# Patient Record
Sex: Male | Born: 1955 | Race: White | Hispanic: No | Marital: Married | Smoking: Never smoker
Health system: Southern US, Community
[De-identification: ages and names within clinical notes are randomized; demographics above are authoritative.]

## PROBLEM LIST (undated history)

## (undated) DIAGNOSIS — E785 Hyperlipidemia, unspecified: Secondary | ICD-10-CM

## (undated) DIAGNOSIS — N183 Chronic kidney disease, stage 3 unspecified: Secondary | ICD-10-CM

## (undated) DIAGNOSIS — N400 Enlarged prostate without lower urinary tract symptoms: Secondary | ICD-10-CM

## (undated) DIAGNOSIS — N200 Calculus of kidney: Secondary | ICD-10-CM

## (undated) DIAGNOSIS — E119 Type 2 diabetes mellitus without complications: Secondary | ICD-10-CM

## (undated) HISTORY — PX: KIDNEY STONE SURGERY: SHX686

---

## 1977-07-29 HISTORY — PX: KIDNEY STONE SURGERY: SHX686

## 2016-04-02 ENCOUNTER — Emergency Department: Payer: 59

## 2016-04-02 ENCOUNTER — Encounter: Payer: Self-pay | Admitting: Emergency Medicine

## 2016-04-02 ENCOUNTER — Emergency Department
Admission: EM | Admit: 2016-04-02 | Discharge: 2016-04-02 | Disposition: A | Discharge status: Home / Self Care | Payer: 59 | Attending: Emergency Medicine | Admitting: Emergency Medicine

## 2016-04-02 DIAGNOSIS — R339 Retention of urine, unspecified: Secondary | ICD-10-CM

## 2016-04-02 DIAGNOSIS — E119 Type 2 diabetes mellitus without complications: Secondary | ICD-10-CM | POA: Diagnosis not present

## 2016-04-02 DIAGNOSIS — K5900 Constipation, unspecified: Secondary | ICD-10-CM

## 2016-04-02 HISTORY — DX: Type 2 diabetes mellitus without complications: E11.9

## 2016-04-02 LAB — CBC WITH DIFFERENTIAL/PLATELET
BASOS ABS: 0 10*3/uL (ref 0–0.1)
BASOS PCT: 1 %
EOS PCT: 0 %
Eosinophils Absolute: 0 10*3/uL (ref 0–0.7)
HCT: 42 % (ref 40.0–52.0)
Hemoglobin: 14.7 g/dL (ref 13.0–18.0)
LYMPHS PCT: 11 %
Lymphs Abs: 1.1 10*3/uL (ref 1.0–3.6)
MCH: 30.7 pg (ref 26.0–34.0)
MCHC: 34.9 g/dL (ref 32.0–36.0)
MCV: 88 fL (ref 80.0–100.0)
MONO ABS: 0.4 10*3/uL (ref 0.2–1.0)
Monocytes Relative: 4 %
Neutro Abs: 8.5 10*3/uL — ABNORMAL HIGH (ref 1.4–6.5)
Neutrophils Relative %: 84 %
PLATELETS: 259 10*3/uL (ref 150–440)
RBC: 4.77 MIL/uL (ref 4.40–5.90)
RDW: 13.8 % (ref 11.5–14.5)
WBC: 10.1 10*3/uL (ref 3.8–10.6)

## 2016-04-02 LAB — URINALYSIS COMPLETE WITH MICROSCOPIC (ARMC ONLY)
Bacteria, UA: NONE SEEN
Bilirubin Urine: NEGATIVE
Glucose, UA: >500 mg/dL — AB
Leukocytes, UA: NEGATIVE
NITRITE: NEGATIVE
PH: 5 (ref 5.0–8.0)
PROTEIN: NEGATIVE mg/dL
SPECIFIC GRAVITY, URINE: 1.027 (ref 1.005–1.030)
Squamous Epithelial / LPF: NONE SEEN

## 2016-04-02 LAB — BASIC METABOLIC PANEL
Anion gap: 6 (ref 5–15)
BUN: 22 mg/dL — ABNORMAL HIGH (ref 6–20)
CO2: 22 mmol/L (ref 22–32)
Calcium: 9.2 mg/dL (ref 8.9–10.3)
Chloride: 108 mmol/L (ref 101–111)
Creatinine, Ser: 1.3 mg/dL — ABNORMAL HIGH (ref 0.61–1.24)
GFR calc Af Amer: >60 mL/min (ref >60)
GFR calc non Af Amer: 59 mL/min — ABNORMAL LOW (ref >60)
Glucose, Bld: 243 mg/dL — ABNORMAL HIGH (ref 65–99)
Potassium: 4.8 mmol/L (ref 3.5–5.1)
Sodium: 136 mmol/L (ref 135–145)

## 2016-04-02 MED ORDER — LIDOCAINE HCL 2 % EX GEL
1.0000 "application " | Freq: Once | CUTANEOUS | Status: AC
  Administered 2016-04-02: 1 via URETHRAL
  Filled 2016-04-02: qty 5

## 2016-04-02 MED ORDER — LIDOCAINE HCL 2 % EX GEL
CUTANEOUS | Status: AC
Start: 2016-04-02 — End: 2016-04-02
  Administered 2016-04-02: 1 via URETHRAL
  Filled 2016-04-02: qty 10

## 2016-04-02 NOTE — Discharge Instructions (Signed)
Please return immediately if condition worsens. Please contact her primary physician or the physician you were given for referral. If you have any specialist physicians involved in her treatment and plan please also contact them. Thank you for using [facility] emergency [facility].  Return to emergency department especially for fever. Please contact the urologist for further outpatient follow-up for urinary retention and likely enlarged prostate

## 2016-04-02 NOTE — ED Notes (Signed)
Bladder scan: 693mL 

## 2016-04-02 NOTE — ED Triage Notes (Signed)
Pt to ED from home c/o urinary retention since for several days.  Pt states decreased urine x1 month but getting worse.  States today the feeling to pee but only had a few drops.  States burning with urination, darker and stronger odor than normal.

## 2016-04-02 NOTE — ED Provider Notes (Signed)
Time Seen: Approximately1903  I have reviewed the triage notes  Chief Complaint: Urinary Retention   History of Present Illness: Ryan Pugh is a 60 y.o. male who states that he's had some symptoms consistent with urinary retention but is still been able to urinate with straining over the past several days. He states today his been more intense and is not able to urinate essentially just dribbled urine on occasion. He was able to give [place] a urine sample. Ultrasound after urination in triage area shows greater than 600 mL of retained urine. She did discuss this with his primary physician and thought that he might have some prostatic hypertrophy and is also on medication that may cause urinary retention for his diabetes. He states his blood sugars have been running fine his most recent hemoglobin A1c is within normal limits. He states he's also had some symptoms of constipation without low back pain or leg weakness. He states he normally has a bowel movement every other day but is been several days since he's had a normal bowel movement. He denies any abdominal discomfort.   Past Medical History:  Diagnosis Date  . Diabetes mellitus without complication (HCC)     There are no active problems to display for this patient.   Past Surgical History:  Procedure Laterality Date  . KIDNEY STONE SURGERY      Past Surgical History:  Procedure Laterality Date  . KIDNEY STONE SURGERY        Allergies:  Review of patient's allergies indicates no known allergies.  Family History: History reviewed. No pertinent family history.  Social History: Social History  Substance Use Topics  . Smoking status: Never Smoker  . Smokeless tobacco: Never Used  . Alcohol use No     Review of Systems:   10 point review of systems was performed and was otherwise negative:  Constitutional: No fever Eyes: No visual disturbances ENT: No sore throat, ear pain Cardiac: No chest pain Respiratory: No  shortness of breath, wheezing, or stridor Abdomen: No abdominal pain, no vomiting, No diarrhea Endocrine: No weight loss, No night sweats Extremities: No peripheral edema, cyanosis Skin: No rashes, easy bruising Neurologic: No focal weakness, trouble with speech or swollowing Urologic: Patient denies any burning with urination or obvious hematuria   Physical Exam:  ED Triage Vitals  Enc Vitals Group     BP 04/02/16 1627 128/71     Pulse Rate 04/02/16 1627 (!) 106     Resp 04/02/16 1627 18     Temp 04/02/16 1627 98 F (36.7 C)     Temp Source 04/02/16 1627 Oral     SpO2 04/02/16 1627 97 %     Weight 04/02/16 1628 184 lb (83.5 kg)     Height 04/02/16 1628 5\' 9"  (1.753 m)     Head Circumference --      Peak Flow --      Pain Score 04/02/16 1942 2     Pain Loc --      Pain Edu? --      Excl. in GC? --     General: Awake , Alert , and Oriented times 3; GCS 15 Head: Normal cephalic , atraumatic Eyes: Pupils equal , round, reactive to light Nose/Throat: No nasal drainage, patent upper airway without erythema or exudate.  Neck: Supple, Full range of motion, No anterior adenopathy or palpable thyroid masses Lungs: Clear to ascultation without wheezes , rhonchi, or rales Heart: Regular rate, regular rhythm without murmurs ,  gallops , or rubs Abdomen: Soft, non tender without rebound, guarding , or rigidity; bowel sounds positive and symmetric in all 4 quadrants. No organomegaly .        Extremities: 2 plus symmetric pulses. No edema, clubbing or cyanosis Neurologic: normal ambulation, Motor symmetric without deficits, sensory intact Skin: warm, dry, no rashes   Labs:   All laboratory work was reviewed including any pertinent negatives or positives listed below:  Labs Reviewed  URINALYSIS COMPLETEWITH MICROSCOPIC ([facility] ONLY) - Abnormal; Notable for the following:       Result Value   Color, Urine YELLOW (*)    APPearance CLEAR (*)    Glucose, UA >500 (*)    Ketones, ur TRACE  (*)    Hgb urine dipstick 2+ (*)    All other components within normal limits  BASIC METABOLIC PANEL - Abnormal; Notable for the following:    Glucose, Bld 243 (*)    BUN 22 (*)    Creatinine, Ser 1.30 (*)    GFR calc non Af Amer 59 (*)    All other components within normal limits  CBC WITH DIFFERENTIAL/PLATELET - Abnormal; Notable for the following:    Neutro Abs 8.5 (*)    All other components within normal limits  I felt given his history of diabetes his lab work appears to be within normal limits   Radiology  "Dg Abd Acute W/chest  Result Date: 04/02/2016 CLINICAL DATA:  60 year old male with constipation EXAM: DG ABDOMEN ACUTE W/ 1V CHEST COMPARISON:  None. FINDINGS: The lungs are clear. There is no pleural effusion or pneumothorax. The cardiac silhouette is within normal limits. There is copious amount of stool throughout the colon. No bowel dilatation or evidence of obstruction. No free air or radiopaque calculi. There is mild degenerative changes of the spine. No acute fracture. IMPRESSION: No acute cardiopulmonary process. Constipation.  No bowel obstruction. Electronically Signed   By: Elgie CollardArash  Radparvar M.D.   On: 04/02/2016 19:58  "  Procedures:  Patient had a Foley catheter inserted by the nursing staff and had greater than 400 mL of urine output. His large bag was switched out for a leg bag with instructions  ED Course:  Patient's stay here was uneventful and felt improved with his Foley catheter though still has some urinary urgency. Patient was referred to urology unassigned. We also discussed his constipation as this may help with the results as constipation with his urinary output. He was given constipation instructions.   Clinical Course     Assessment: * Urinary retention Constipation History of diabetes  Final Clinical Impression:   Final diagnoses:  Urinary retention  Constipation, unspecified constipation type     Plan:  Outpatient Patient was  advised to return immediately if condition worsens. Patient was advised to follow up with their primary care physician or other specialized physicians involved in their outpatient care. The patient and/or family member/power of attorney had laboratory results reviewed at the bedside. All questions and concerns were addressed and appropriate discharge instructions were distributed by the nursing staff.             Jennye MoccasinBrian S Kennadee Walthour, MD 04/02/16 2117

## 2016-04-02 NOTE — ED Notes (Signed)
Discharge instructions reviewed with patient. Patient verbalized understanding. Patient ambulated to lobby without difficulty.   

## 2016-04-02 NOTE — ED Notes (Signed)
Pt called for room x1 with no answer

## 2016-04-02 NOTE — ED Notes (Addendum)
Pt's catheter switched to leg bag, pt given education on catheter and leg bag. Pt gave this RN return demonstration on how to switch from regular bag to leg bag and verbalized understanding of education.

## 2016-04-18 ENCOUNTER — Ambulatory Visit: Payer: Self-pay | Admitting: Urology

## 2017-07-07 IMAGING — CR DG ABDOMEN ACUTE W/ 1V CHEST
4 series · 4 of 4 positions shown · non-contrast
Comparison: None.

CLINICAL DATA: 52-year-old male with constipation

EXAM:
DG ABDOMEN ACUTE W/ 1V CHEST

[chest pa]
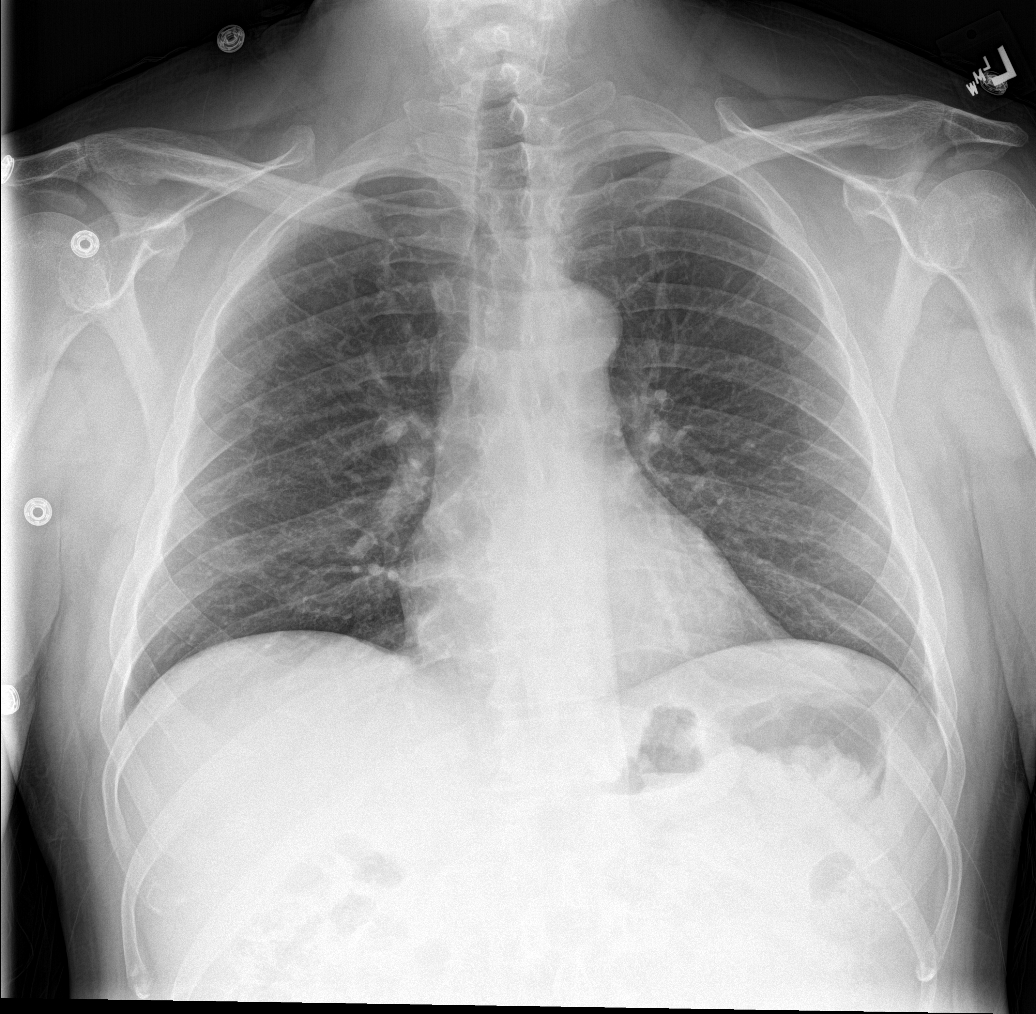

[abdomen erect]
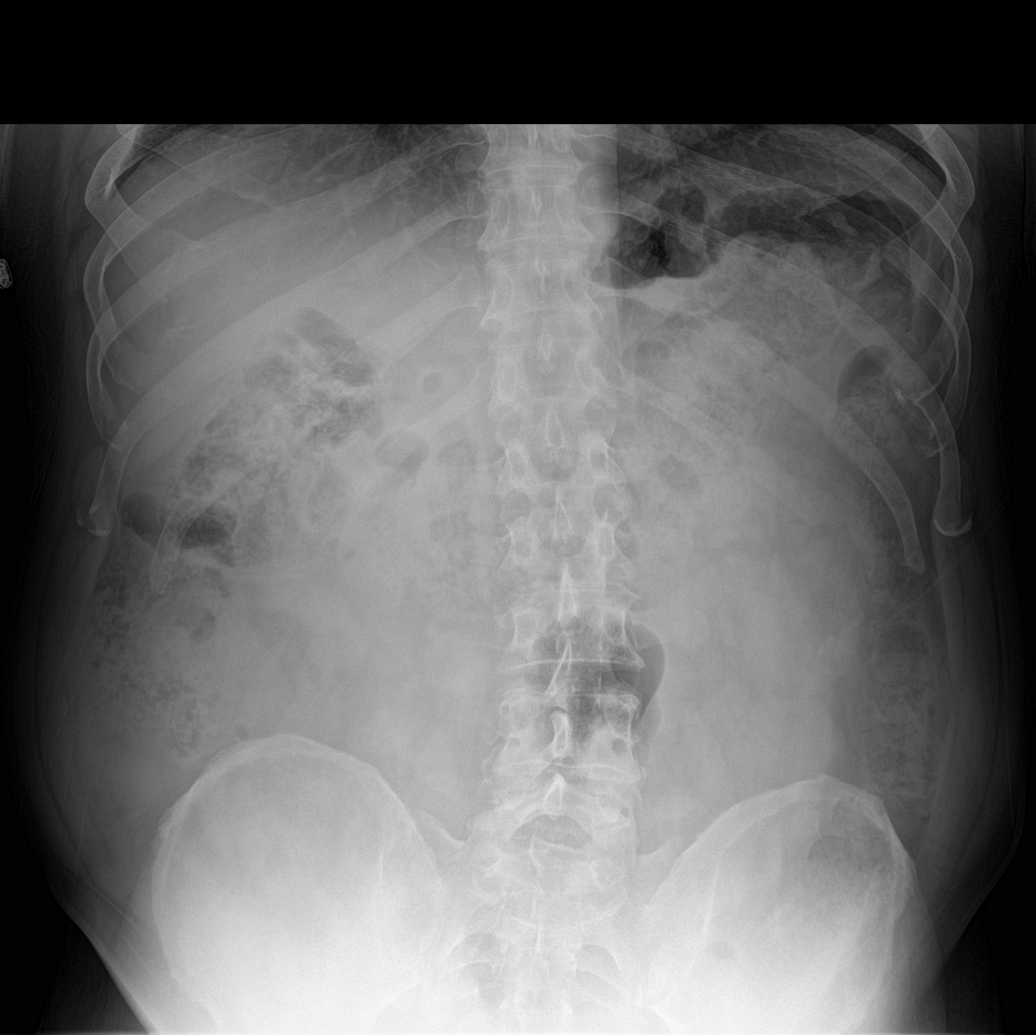

[abdomen supine (1 of 2)]
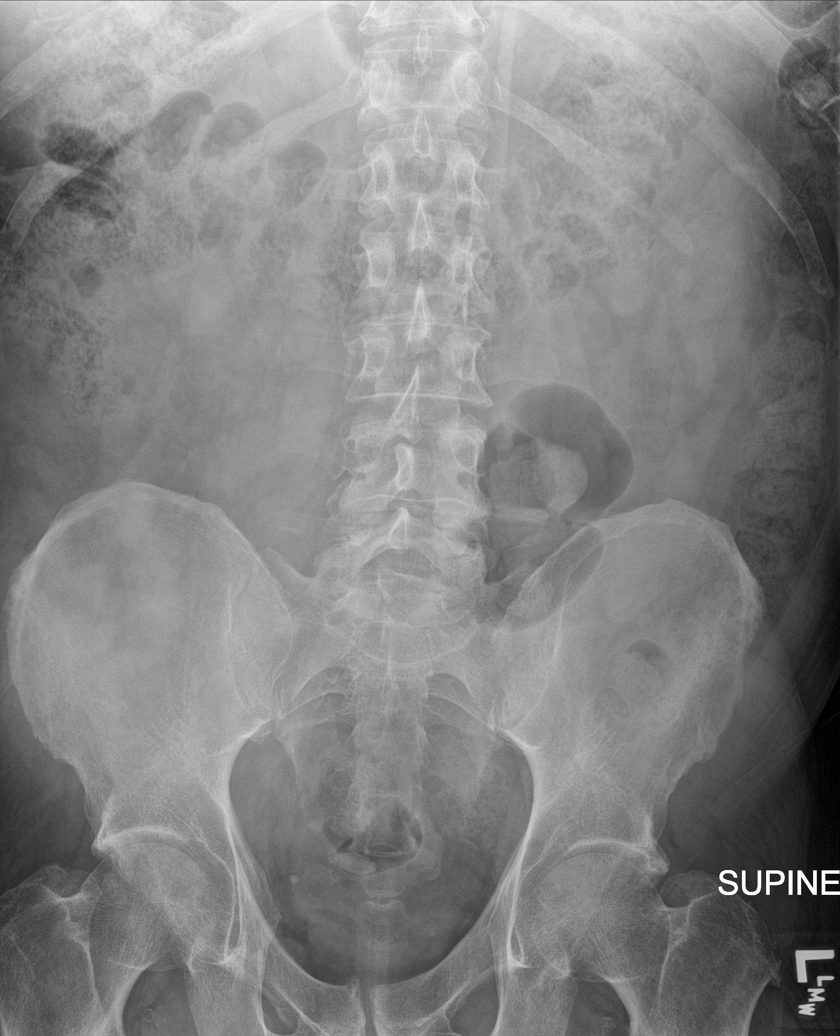

[abdomen supine (2 of 2)]
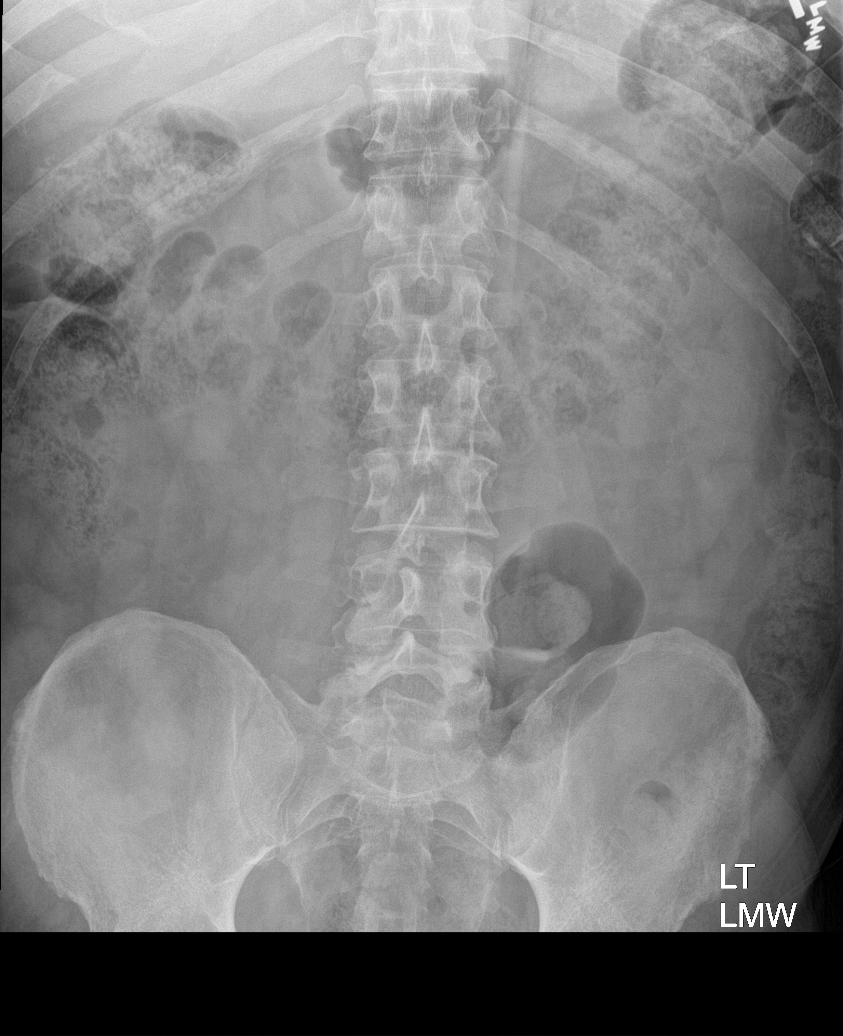

[4 of 4 positions shown; findings below may reference images not displayed]

FINDINGS: The lungs are clear. There is no pleural effusion or pneumothorax.
The cardiac silhouette is within normal limits.

There is copious amount of stool throughout the colon. No bowel
dilatation or evidence of obstruction. No free air or radiopaque
calculi. There is mild degenerative changes of the spine. No acute
fracture.
IMPRESSION: No acute cardiopulmonary process.

Constipation.  No bowel obstruction.

## 2021-07-12 ENCOUNTER — Encounter: Payer: Self-pay | Admitting: Emergency Medicine

## 2021-07-12 ENCOUNTER — Other Ambulatory Visit: Payer: Self-pay

## 2021-07-12 ENCOUNTER — Emergency Department: Payer: Managed Care, Other (non HMO)

## 2021-07-12 ENCOUNTER — Inpatient Hospital Stay
Admission: EM | Admit: 2021-07-12 | Discharge: 2021-07-15 | DRG: 854 | Disposition: A | Discharge status: Home / Self Care | Payer: Managed Care, Other (non HMO) | Attending: Internal Medicine | Admitting: Internal Medicine

## 2021-07-12 DIAGNOSIS — K760 Fatty (change of) liver, not elsewhere classified: Secondary | ICD-10-CM | POA: Diagnosis present

## 2021-07-12 DIAGNOSIS — N1831 Chronic kidney disease, stage 3a: Secondary | ICD-10-CM | POA: Diagnosis present

## 2021-07-12 DIAGNOSIS — E1122 Type 2 diabetes mellitus with diabetic chronic kidney disease: Secondary | ICD-10-CM | POA: Diagnosis present

## 2021-07-12 DIAGNOSIS — N210 Calculus in bladder: Secondary | ICD-10-CM | POA: Diagnosis present

## 2021-07-12 DIAGNOSIS — N179 Acute kidney failure, unspecified: Secondary | ICD-10-CM | POA: Diagnosis present

## 2021-07-12 DIAGNOSIS — Z808 Family history of malignant neoplasm of other organs or systems: Secondary | ICD-10-CM | POA: Diagnosis not present

## 2021-07-12 DIAGNOSIS — K4090 Unilateral inguinal hernia, without obstruction or gangrene, not specified as recurrent: Secondary | ICD-10-CM | POA: Diagnosis present

## 2021-07-12 DIAGNOSIS — Z87442 Personal history of urinary calculi: Secondary | ICD-10-CM | POA: Diagnosis not present

## 2021-07-12 DIAGNOSIS — R7881 Bacteremia: Secondary | ICD-10-CM | POA: Diagnosis not present

## 2021-07-12 DIAGNOSIS — A411 Sepsis due to other specified staphylococcus: Principal | ICD-10-CM | POA: Diagnosis present

## 2021-07-12 DIAGNOSIS — E1129 Type 2 diabetes mellitus with other diabetic kidney complication: Secondary | ICD-10-CM | POA: Diagnosis present

## 2021-07-12 DIAGNOSIS — N183 Chronic kidney disease, stage 3 unspecified: Secondary | ICD-10-CM | POA: Diagnosis present

## 2021-07-12 DIAGNOSIS — Z7984 Long term (current) use of oral hypoglycemic drugs: Secondary | ICD-10-CM

## 2021-07-12 DIAGNOSIS — I700 Atherosclerosis of aorta: Secondary | ICD-10-CM | POA: Diagnosis present

## 2021-07-12 DIAGNOSIS — Z20822 Contact with and (suspected) exposure to covid-19: Secondary | ICD-10-CM | POA: Diagnosis present

## 2021-07-12 DIAGNOSIS — E1165 Type 2 diabetes mellitus with hyperglycemia: Secondary | ICD-10-CM | POA: Diagnosis present

## 2021-07-12 DIAGNOSIS — N390 Urinary tract infection, site not specified: Secondary | ICD-10-CM | POA: Diagnosis present

## 2021-07-12 DIAGNOSIS — A419 Sepsis, unspecified organism: Secondary | ICD-10-CM

## 2021-07-12 DIAGNOSIS — N1330 Unspecified hydronephrosis: Secondary | ICD-10-CM

## 2021-07-12 DIAGNOSIS — Z7982 Long term (current) use of aspirin: Secondary | ICD-10-CM | POA: Diagnosis not present

## 2021-07-12 DIAGNOSIS — R6520 Severe sepsis without septic shock: Secondary | ICD-10-CM | POA: Diagnosis present

## 2021-07-12 DIAGNOSIS — Z79899 Other long term (current) drug therapy: Secondary | ICD-10-CM | POA: Diagnosis not present

## 2021-07-12 DIAGNOSIS — N136 Pyonephrosis: Secondary | ICD-10-CM | POA: Diagnosis present

## 2021-07-12 DIAGNOSIS — D72829 Elevated white blood cell count, unspecified: Secondary | ICD-10-CM | POA: Diagnosis not present

## 2021-07-12 DIAGNOSIS — E785 Hyperlipidemia, unspecified: Secondary | ICD-10-CM | POA: Diagnosis present

## 2021-07-12 DIAGNOSIS — Z794 Long term (current) use of insulin: Secondary | ICD-10-CM

## 2021-07-12 DIAGNOSIS — R52 Pain, unspecified: Secondary | ICD-10-CM

## 2021-07-12 DIAGNOSIS — N200 Calculus of kidney: Secondary | ICD-10-CM | POA: Diagnosis present

## 2021-07-12 DIAGNOSIS — R000 Tachycardia, unspecified: Secondary | ICD-10-CM

## 2021-07-12 HISTORY — DX: Calculus of kidney: N20.0

## 2021-07-12 HISTORY — DX: Hyperlipidemia, unspecified: E78.5

## 2021-07-12 HISTORY — DX: Chronic kidney disease, stage 3 unspecified: N18.30

## 2021-07-12 LAB — COMPREHENSIVE METABOLIC PANEL
ALT: 26 U/L (ref 0–44)
ALT: 24 U/L (ref 0–44)
AST: 26 U/L (ref 15–41)
AST: 20 U/L (ref 15–41)
Albumin: 4.6 g/dL (ref 3.5–5.0)
Albumin: 3.6 g/dL (ref 3.5–5.0)
Alkaline Phosphatase: 107 U/L (ref 38–126)
Alkaline Phosphatase: 88 U/L (ref 38–126)
Anion gap: 13 (ref 5–15)
Anion gap: 8 (ref 5–15)
BUN: 24 mg/dL — ABNORMAL HIGH (ref 8–23)
BUN: 21 mg/dL (ref 8–23)
CO2: 18 mmol/L — ABNORMAL LOW (ref 22–32)
CO2: 20 mmol/L — ABNORMAL LOW (ref 22–32)
Calcium: 9.3 mg/dL (ref 8.9–10.3)
Calcium: 8.1 mg/dL — ABNORMAL LOW (ref 8.9–10.3)
Chloride: 101 mmol/L (ref 98–111)
Chloride: 105 mmol/L (ref 98–111)
Creatinine, Ser: 1.71 mg/dL — ABNORMAL HIGH (ref 0.61–1.24)
Creatinine, Ser: 1.76 mg/dL — ABNORMAL HIGH (ref 0.61–1.24)
GFR, Estimated: 44 mL/min — ABNORMAL LOW (ref >60)
GFR, Estimated: 43 mL/min — ABNORMAL LOW (ref >60)
Glucose, Bld: 425 mg/dL — ABNORMAL HIGH (ref 70–99)
Glucose, Bld: 366 mg/dL — ABNORMAL HIGH (ref 70–99)
Potassium: 4.3 mmol/L (ref 3.5–5.1)
Potassium: 4.5 mmol/L (ref 3.5–5.1)
Sodium: 132 mmol/L — ABNORMAL LOW (ref 135–145)
Sodium: 133 mmol/L — ABNORMAL LOW (ref 135–145)
Total Bilirubin: 1.5 mg/dL — ABNORMAL HIGH (ref 0.3–1.2)
Total Bilirubin: 0.9 mg/dL (ref 0.3–1.2)
Total Protein: 7.5 g/dL (ref 6.5–8.1)
Total Protein: 6.3 g/dL — ABNORMAL LOW (ref 6.5–8.1)

## 2021-07-12 LAB — CBC WITH DIFFERENTIAL/PLATELET
Abs Immature Granulocytes: 0.08 10*3/uL — ABNORMAL HIGH (ref 0.00–0.07)
Basophils Absolute: 0 10*3/uL (ref 0.0–0.1)
Basophils Relative: 0 %
Eosinophils Absolute: 0 10*3/uL (ref 0.0–0.5)
Eosinophils Relative: 0 %
HCT: 40.7 % (ref 39.0–52.0)
Hemoglobin: 14.1 g/dL (ref 13.0–17.0)
Immature Granulocytes: 1 %
Lymphocytes Relative: 3 %
Lymphs Abs: 0.4 10*3/uL — ABNORMAL LOW (ref 0.7–4.0)
MCH: 30.2 pg (ref 26.0–34.0)
MCHC: 34.6 g/dL (ref 30.0–36.0)
MCV: 87.2 fL (ref 80.0–100.0)
Monocytes Absolute: 0.5 10*3/uL (ref 0.1–1.0)
Monocytes Relative: 3 %
Neutro Abs: 13.3 10*3/uL — ABNORMAL HIGH (ref 1.7–7.7)
Neutrophils Relative %: 93 %
Platelets: 281 10*3/uL (ref 150–400)
RBC: 4.67 MIL/uL (ref 4.22–5.81)
RDW: 12.6 % (ref 11.5–15.5)
WBC: 14.3 10*3/uL — ABNORMAL HIGH (ref 4.0–10.5)
nRBC: 0 % (ref 0.0–0.2)

## 2021-07-12 LAB — CBC
HCT: 42.4 % (ref 39.0–52.0)
Hemoglobin: 15.4 g/dL (ref 13.0–17.0)
MCH: 31 pg (ref 26.0–34.0)
MCHC: 36.3 g/dL — ABNORMAL HIGH (ref 30.0–36.0)
MCV: 85.5 fL (ref 80.0–100.0)
Platelets: 280 10*3/uL (ref 150–400)
RBC: 4.96 MIL/uL (ref 4.22–5.81)
RDW: 12.4 % (ref 11.5–15.5)
WBC: 13.2 10*3/uL — ABNORMAL HIGH (ref 4.0–10.5)
nRBC: 0 % (ref 0.0–0.2)

## 2021-07-12 LAB — RESP PANEL BY RT-PCR (FLU A&B, COVID) ARPGX2
Influenza A by PCR: NEGATIVE
Influenza B by PCR: NEGATIVE
SARS Coronavirus 2 by RT PCR: NEGATIVE

## 2021-07-12 LAB — CBG MONITORING, ED
Glucose-Capillary: 393 mg/dL — ABNORMAL HIGH (ref 70–99)
Glucose-Capillary: 340 mg/dL — ABNORMAL HIGH (ref 70–99)
Glucose-Capillary: 324 mg/dL — ABNORMAL HIGH (ref 70–99)
Glucose-Capillary: 382 mg/dL — ABNORMAL HIGH (ref 70–99)

## 2021-07-12 LAB — URINALYSIS, MICROSCOPIC (REFLEX): Squamous Epithelial / LPF: NONE SEEN (ref 0–5)

## 2021-07-12 LAB — URINALYSIS, ROUTINE W REFLEX MICROSCOPIC
Bilirubin Urine: NEGATIVE
Glucose, UA: >1000 mg/dL — AB
Ketones, ur: 80 mg/dL — AB
Leukocytes,Ua: NEGATIVE
Nitrite: POSITIVE — AB
Protein, ur: NEGATIVE mg/dL
Specific Gravity, Urine: 1.01 (ref 1.005–1.030)
pH: 6.5 (ref 5.0–8.0)

## 2021-07-12 LAB — GLUCOSE, CAPILLARY: Glucose-Capillary: 349 mg/dL — ABNORMAL HIGH (ref 70–99)

## 2021-07-12 LAB — HIV ANTIBODY (ROUTINE TESTING W REFLEX): HIV Screen 4th Generation wRfx: NONREACTIVE

## 2021-07-12 LAB — PROTIME-INR
INR: 1.1 (ref 0.8–1.2)
Prothrombin Time: 13.8 seconds (ref 11.4–15.2)

## 2021-07-12 LAB — LACTIC ACID, PLASMA
Lactic Acid, Venous: 2.8 mmol/L (ref 0.5–1.9)
Lactic Acid, Venous: 3 mmol/L (ref 0.5–1.9)

## 2021-07-12 LAB — LIPASE, BLOOD: Lipase: 41 U/L (ref 11–51)

## 2021-07-12 LAB — APTT: aPTT: 23 seconds — ABNORMAL LOW (ref 24–36)

## 2021-07-12 MED ORDER — HYDROMORPHONE HCL 1 MG/ML IJ SOLN
1.0000 mg | Freq: Once | INTRAMUSCULAR | Status: AC
  Administered 2021-07-12: 1 mg via INTRAVENOUS
  Filled 2021-07-12: qty 1

## 2021-07-12 MED ORDER — SODIUM CHLORIDE 0.9 % IV SOLN
1.0000 g | INTRAVENOUS | Status: DC
  Administered 2021-07-13: 2 g via INTRAVENOUS
  Filled 2021-07-12: qty 1

## 2021-07-12 MED ORDER — LEVOFLOXACIN 500 MG PO TABS: 500.0000 mg | ORAL_TABLET | Freq: Every day | ORAL | 0 refills | Status: DC

## 2021-07-12 MED ORDER — TAMSULOSIN HCL 0.4 MG PO CAPS: 0.4000 mg | ORAL_CAPSULE | Freq: Every day | ORAL | 0 refills | Status: DC

## 2021-07-12 MED ORDER — SODIUM CHLORIDE 0.9 % IV BOLUS
1000.0000 mL | Freq: Once | INTRAVENOUS | Status: AC
  Administered 2021-07-12: 1000 mL via INTRAVENOUS

## 2021-07-12 MED ORDER — ONDANSETRON 4 MG PO TBDP: 4.0000 mg | ORAL_TABLET | Freq: Three times a day (TID) | ORAL | 0 refills | Status: DC | PRN

## 2021-07-12 MED ORDER — METOCLOPRAMIDE HCL 5 MG/ML IJ SOLN
10.0000 mg | Freq: Once | INTRAMUSCULAR | Status: AC
  Administered 2021-07-12: 10 mg via INTRAVENOUS
  Filled 2021-07-12: qty 2

## 2021-07-12 MED ORDER — INSULIN ASPART 100 UNIT/ML IJ SOLN
0.0000 [IU] | Freq: Three times a day (TID) | INTRAMUSCULAR | Status: DC
  Administered 2021-07-13 (×2): 3 [IU] via SUBCUTANEOUS
  Administered 2021-07-13: 5 [IU] via SUBCUTANEOUS
  Administered 2021-07-14: 1 [IU] via SUBCUTANEOUS
  Administered 2021-07-14: 2 [IU] via SUBCUTANEOUS
  Administered 2021-07-14: 1 [IU] via SUBCUTANEOUS
  Administered 2021-07-15: 12:00:00 2 [IU] via SUBCUTANEOUS
  Filled 2021-07-12 (×7): qty 1

## 2021-07-12 MED ORDER — CIPROFLOXACIN IN D5W 400 MG/200ML IV SOLN: 400.0000 mg | Freq: Once | INTRAVENOUS | Status: DC

## 2021-07-12 MED ORDER — INSULIN ASPART 100 UNIT/ML IJ SOLN
0.0000 [IU] | Freq: Every day | INTRAMUSCULAR | Status: DC
  Administered 2021-07-12: 21:00:00 4 [IU] via SUBCUTANEOUS
  Administered 2021-07-13: 2 [IU] via SUBCUTANEOUS
  Filled 2021-07-12 (×2): qty 1

## 2021-07-12 MED ORDER — ACETAMINOPHEN 325 MG PO TABS: 650.0000 mg | ORAL_TABLET | Freq: Four times a day (QID) | ORAL | Status: DC | PRN

## 2021-07-12 MED ORDER — MORPHINE SULFATE (PF) 2 MG/ML IV SOLN
2.0000 mg | INTRAVENOUS | Status: DC | PRN
  Administered 2021-07-12 (×2): 2 mg via INTRAVENOUS
  Filled 2021-07-12 (×2): qty 1

## 2021-07-12 MED ORDER — OXYCODONE-ACETAMINOPHEN 5-325 MG PO TABS
1.0000 | ORAL_TABLET | ORAL | Status: DC | PRN
  Administered 2021-07-12: 1 via ORAL
  Filled 2021-07-12: qty 1

## 2021-07-12 MED ORDER — OXYCODONE-ACETAMINOPHEN 7.5-325 MG PO TABS: 1.0000 | ORAL_TABLET | ORAL | 0 refills | Status: DC | PRN

## 2021-07-12 MED ORDER — FENTANYL CITRATE PF 50 MCG/ML IJ SOSY
25.0000 ug | PREFILLED_SYRINGE | INTRAMUSCULAR | Status: DC | PRN
  Administered 2021-07-12 – 2021-07-13 (×4): 25 ug via INTRAVENOUS
  Filled 2021-07-12 (×4): qty 1

## 2021-07-12 MED ORDER — FENTANYL CITRATE PF 50 MCG/ML IJ SOSY
50.0000 ug | PREFILLED_SYRINGE | Freq: Once | INTRAMUSCULAR | Status: AC
  Administered 2021-07-12: 50 ug via INTRAVENOUS
  Filled 2021-07-12: qty 1

## 2021-07-12 MED ORDER — INSULIN ASPART 100 UNIT/ML IV SOLN
5.0000 [IU] | Freq: Once | INTRAVENOUS | Status: AC
  Administered 2021-07-12: 5 [IU] via INTRAVENOUS
  Filled 2021-07-12 (×2): qty 0.05

## 2021-07-12 MED ORDER — MORPHINE SULFATE (PF) 2 MG/ML IV SOLN: 2.0000 mg | INTRAVENOUS | Status: DC | PRN

## 2021-07-12 MED ORDER — AMOXICILLIN-POT CLAVULANATE 875-125 MG PO TABS: 1.0000 | ORAL_TABLET | Freq: Two times a day (BID) | ORAL | 0 refills | Status: DC

## 2021-07-12 MED ORDER — INSULIN GLARGINE-YFGN 100 UNIT/ML ~~LOC~~ SOLN
12.0000 [IU] | Freq: Every day | SUBCUTANEOUS | Status: DC
  Administered 2021-07-12 – 2021-07-15 (×4): 12 [IU] via SUBCUTANEOUS
  Filled 2021-07-12 (×5): qty 0.12

## 2021-07-12 MED ORDER — SODIUM CHLORIDE 0.9 % IV SOLN
1.0000 g | Freq: Once | INTRAVENOUS | Status: AC
  Administered 2021-07-12: 1 g via INTRAVENOUS
  Filled 2021-07-12: qty 10

## 2021-07-12 MED ORDER — SODIUM CHLORIDE 0.9 % IV SOLN: INTRAVENOUS | Status: DC

## 2021-07-12 MED ORDER — ONDANSETRON HCL 4 MG/2ML IJ SOLN
4.0000 mg | Freq: Once | INTRAMUSCULAR | Status: AC
  Administered 2021-07-12: 4 mg via INTRAVENOUS
  Filled 2021-07-12: qty 2

## 2021-07-12 MED ORDER — ONDANSETRON HCL 4 MG/2ML IJ SOLN
4.0000 mg | Freq: Three times a day (TID) | INTRAMUSCULAR | Status: DC | PRN
  Administered 2021-07-12 – 2021-07-13 (×2): 4 mg via INTRAVENOUS
  Filled 2021-07-12 (×2): qty 2

## 2021-07-12 MED ORDER — IOHEXOL 300 MG/ML  SOLN
80.0000 mL | Freq: Once | INTRAMUSCULAR | Status: AC | PRN
  Administered 2021-07-12: 80 mL via INTRAVENOUS
  Filled 2021-07-12: qty 80

## 2021-07-12 NOTE — ED Notes (Signed)
Pt to ED for sudden onset N/V and abdominal pain that started this morning. EMS IV 20# L AC in place. CBG was in 400s when EMS checked, see triage note. Hx DM, usually well controlled. Denies other PMH. Pt appears pale, tachypneic and slightly diaphoretic. Complains of LLQ sharp abdominal pain, 10/10 with dull pain throughout abdomen. Bowel sounds are hypoactive and abdomen appears distended.

## 2021-07-12 NOTE — Consult Note (Signed)
Urology Consult   I have been asked to see the patient by Dr. Blaine Hamper, for evaluation and management of bladder stone, left hydronephrosis, possible UTI.  Chief Complaint: Left flank pain  HPI:  Ryan Pugh is a 65 y.o. year old with diabetes(hemoglobin A1c 8.8) who presents with 12 hours of severe left-sided flank pain and nausea.  He denies any fevers, chills, or urinary symptoms.  He denies any problems with urination aside from weak stream overnight.  He does have a history of urinary retention in 2017, and catheter was reportedly removed 2 to 3 weeks later.  He has not followed with a urologist since then.  He reports a kidney stone when he was a teenager that required surgery.  He denies any gross hematuria or dysuria.  He denies any history of UTI.  PSA has been normal, most recently 3.4 in August 2022 which was stable over the last few years.  CT in the ER with contrast shows minimal left-sided hydroureteronephrosis with some significant perinephric stranding as well as stranding around the proximal ureter.  No ureteral stones are visualized.  There is a large 3 cm bladder stone.  Prostate measures 41 g.  No prior cross-sectional imaging to review.  Labs in the ER notable for mild leukocytosis to 14 K with left shift, AKI with creatinine of 1.76(eGFR 43) from a baseline creatinine of ~1.3.  Urinalysis nitrite positive, negative leukocytes, rare bacteria, 0-5 WBCs, 0-5 RBCs.  Lactic acid pending.  PMH: Past Medical History:  Diagnosis Date   CKD (chronic kidney disease), stage III ([place])    Diabetes mellitus without complication ([place])    HLD (hyperlipidemia)    Kidney stone     Surgical History: Past Surgical History:  Procedure Laterality Date   KIDNEY STONE SURGERY       Allergies: No Known Allergies   Social History:  reports that he has never smoked. He has never used smokeless tobacco. He reports that he does not drink alcohol and does not use drugs.  ROS: Negative  aside from those stated in the HPI.  Physical Exam: BP (!) 148/89    Pulse (!) 114    Temp 98.9 F (37.2 C) (Oral)    Resp 19    Ht 5' 9"  (1.753 m)    Wt 83.5 kg    SpO2 97%    BMI 27.17 kg/m    Constitutional: Resting comfortably in bed, conversational Cardiovascular: Tachycardic, regular rhythm Respiratory: Clear to auscultation bilaterally GI: Abdomen is soft, nontender, nondistended, no abdominal masses GU: Left CVA tenderness Lymph: No cervical or inguinal lymphadenopathy. Skin: No rashes, bruises or suspicious lesions. Neurologic: Grossly intact, no focal deficits, moving all 4 extremities. Psychiatric: Normal mood and affect.   Laboratory Data: Reviewed, see HPI  Pertinent Imaging: I have personally reviewed the CT in the ER with contrast shows minimal left-sided hydroureteronephrosis with some significant perinephric stranding as well as stranding around the proximal ureter.  No ureteral stones are visualized.  There is a large 3 cm bladder stone.  Prostate measures 41 g.  No prior cross-sectional imaging to review.  Assessment & Plan:   65 year old male with acute onset of 12 hours of left-sided flank pain and nausea of unclear etiology.  CT shows minimal left-sided hydronephrosis with no evidence of ureteral stones, there is a large 3 cm bladder stone in a nondistended bladder.  Mild leukocytosis with a left shift, but urinalysis relatively benign.  Lactic acid pending.  Afebrile but  tachycardic, normotensive in ER.  We discussed possible etiologies including spontaneously passed stone, pyelonephritis, other obstructing left ureteral lesion like mass or debris from infection, as well as likely incomplete long-term bladder emptying with large bladder stone.  Possible etiologies of his bladder stone include BPH, urethral stricture, or atonic bladder from long-term poorly controlled diabetes.  Recommendations: -Agree with admission for antibiotics and resuscitation, follow-up  cultures -No plan for urgent ureteral stent placement with no evidence of obstructing lesion and clinical picture more consistent with pyelonephritis -NPO at midnight in case of any clinical decline or persistent severe pain that would warrant left retrograde pyelogram, possible stent placement -Will ultimately need cystoscopy as outpatient to evaluate for urethral stricture, and consider cystolitholapaxy for bladder stone, potentially outlet procedures   Billey Co, MD  [facility] [address], [place], [place] [address] [phone]

## 2021-07-12 NOTE — ED Notes (Signed)
Patient transported to CT 

## 2021-07-12 NOTE — ED Notes (Signed)
Niu MD made aware of pts lactic of 2.8

## 2021-07-12 NOTE — ED Triage Notes (Signed)
Pt comes into the ED via [facility] from home c/o LLQ abd pain with sudden onset along with N/V.  Pain is described as sharp that increased with palpation and movement.  Pt states that every time he moves, he vomits.  IV given, 4mg  zofran given, 500 cc of NaCl given with CBG at 479.  176/90 90 HR 99% RA 20 RR

## 2021-07-12 NOTE — ED Notes (Signed)
CBG 393. 

## 2021-07-12 NOTE — ED Notes (Signed)
Pt now vomiting

## 2021-07-12 NOTE — Discharge Instructions (Signed)
Follow-up with Dr. Signa Kell.  Please call for an appointment if you have not heard from his office in 1 day. Take medication as prescribed Return if worsening

## 2021-07-12 NOTE — H&P (Signed)
History and Physical    Jesusantonio Filardi [id number] DOB: 02-20-1956 DOA: 07/12/2021  Referring MD/NP/PA:   PCP: Su Grand, MD   Patient coming from:  The patient is coming from home.  At baseline, pt is independent for most of ADL.        Chief Complaint: abdominal pain  HPI: Ryan Pugh is a 65 y.o. male with medical history significant of kidney stone, CKD-3A, hyperlipidemia, diabetes mellitus, who presents with abdominal pain.  Pt states that he has sudden onset abdominal pain which started in early morning.  Associate with nausea and vomiting.  No diarrhea.  No fever or chills. His abdominal pain is located in left lower quadrant, constant, sharp, severe, nonradiating.  Patient has had increased urinary frequency, no dysuria or burning on urination.  No hematuria.  Patient denies chest pain, cough, shortness of breath.  ED Course: pt was found to have WBC 13.2, pending COVID PCR, urinalysis (clear appearance, negative leukocyte, positive nitrate, rare bacteria, WBC 0-5), worsening renal function, temperature normal, blood pressure 148/89, heart rate 114, RR 22, oxygen saturation 94-97% on room air. CT scan showed left hydroureteronephrosis and a large bladder calculus. Patient is admitted to [facility] bed as inpatient.  Dr. Diamantina Providence of urology is consulted.  CT-abd/pelvis Minimal left hydroureteronephrosis is noted with perinephric stranding which appears to be due to focal high density area seen in midportion of left ureter. It is uncertain if this represents small stone or stones or possibly blood clot. Small nonobstructive calculus is noted in lower pole collecting system of left kidney.   3 cm bladder calculus is noted in dependent portion of urinary bladder.   Moderate sized right inguinal hernia is noted which contains a small portion of the urinary bladder.   Probable hepatic steatosis.   Aortic Atherosclerosis (ICD10-I70.0).   Review of Systems:   General: no  fevers, chills, no body weight gain, has fatigue HEENT: no blurry vision, hearing changes or sore throat Respiratory: no dyspnea, coughing, wheezing CV: no chest pain, no palpitations GI: has nausea, vomiting, abdominal pain, no diarrhea, constipation GU: no dysuria, burning on urination, has increased urinary frequency, no hematuria  Ext: no leg edema Neuro: no unilateral weakness, numbness, or tingling, no vision change or hearing loss Skin: no rash, no skin tear. MSK: No muscle spasm, no deformity, no limitation of range of movement in spin Heme: No easy bruising.  Travel history: No recent long distant travel.  Allergy: No Known Allergies  Past Medical History:  Diagnosis Date   CKD (chronic kidney disease), stage III (HCC)    Diabetes mellitus without complication ([place])    HLD (hyperlipidemia)    Kidney stone     Past Surgical History:  Procedure Laterality Date   KIDNEY STONE SURGERY      Social History:  reports that he has never smoked. He has never used smokeless tobacco. He reports that he does not drink alcohol and does not use drugs.  Family History:  Family History  Problem Relation Age of Onset   Diabetes Mother    Diabetes Brother    Throat cancer Brother      Prior to Admission medications   Medication Sig Start Date End Date Taking? Authorizing Provider  levofloxacin (LEVAQUIN) 500 MG tablet Take 1 tablet (500 mg total) by mouth daily for 10 days. 07/12/21 07/22/21 Yes Fisher, Linden Dolin, PA-C  ondansetron (ZOFRAN-ODT) 4 MG disintegrating tablet Take 1 tablet (4 mg total) by mouth every 8 (eight) hours  as needed. 07/12/21  Yes Fisher, Linden Dolin, PA-C  oxyCODONE-acetaminophen (PERCOCET) 7.5-325 MG tablet Take 1 tablet by mouth every 4 (four) hours as needed for severe pain. 07/12/21 07/12/22 Yes Fisher, Linden Dolin, PA-C  tamsulosin (FLOMAX) 0.4 MG CAPS capsule Take 1 capsule (0.4 mg total) by mouth daily. 07/12/21  Yes Versie Starks, PA-C    Physical  Exam: Vitals:   07/12/21 1121 07/12/21 1123 07/12/21 1400 07/12/21 1430  BP: (!) 158/85  (!) 158/86 (!) 148/89  Pulse: 87  (!) 112 (!) 114  Resp: 18  (!) 22 19  Temp: 98.9 F (37.2 C)     TempSrc: Oral     SpO2: 94%  97% 97%  Weight:  83.5 kg    Height:  5\' 9"  (1.753 m)     General: Not in acute distress HEENT:       Eyes: PERRL, EOMI, no scleral icterus.       ENT: No discharge from the ears and nose, no pharynx injection, no tonsillar enlargement.        Neck: No JVD, no bruit, no mass felt. Heme: No neck lymph node enlargement. Cardiac: S1/S2, RRR, No murmurs, No gallops or rubs. Respiratory: No rales, wheezing, rhonchi or rubs. GI: Soft, nondistended, nontender, no rebound pain, no organomegaly, BS present. GU: No hematuria Ext: No pitting leg edema bilaterally. 1+DP/PT pulse bilaterally. Musculoskeletal: No joint deformities, No joint redness or warmth, no limitation of ROM in spin. Skin: No rashes.  Neuro: Alert, oriented X3, cranial nerves II-XII grossly intact, moves all extremities normally.  Psych: Patient is not psychotic, no suicidal or hemocidal ideation.  Labs on Admission: I have personally reviewed following labs and imaging studies  CBC: Recent Labs  Lab 07/12/21 1030 07/12/21 1553  WBC 13.2* 14.3*  NEUTROABS  --  13.3*  HGB 15.4 14.1  HCT 42.4 40.7  MCV 85.5 87.2  PLT 280 [id number]   Basic Metabolic Panel: Recent Labs  Lab 07/12/21 1030 07/12/21 1553  NA 132* 133*  K 4.3 4.5  CL 101 105  CO2 18* 20*  GLUCOSE 425* 366*  BUN 24* 21  CREATININE 1.71* 1.76*  CALCIUM 9.3 8.1*   GFR: Estimated Creatinine Clearance: 43 mL/min (A) (by C-G formula based on SCr of 1.76 mg/dL (H)). Liver Function Tests: Recent Labs  Lab 07/12/21 1030 07/12/21 1553  AST 26 20  ALT 26 24  ALKPHOS 107 88  BILITOT 1.5* 0.9  PROT 7.5 6.3*  ALBUMIN 4.6 3.6   Recent Labs  Lab 07/12/21 1030  LIPASE 41   No results for input(s): AMMONIA in the last 168  hours. Coagulation Profile: Recent Labs  Lab 07/12/21 1628  INR 1.1   Cardiac Enzymes: No results for input(s): CKTOTAL, CKMB, CKMBINDEX, TROPONINI in the last 168 hours. BNP (last 3 results) No results for input(s): PROBNP in the last 8760 hours. HbA1C: No results for input(s): HGBA1C in the last 72 hours. CBG: Recent Labs  Lab 07/12/21 1346 07/12/21 1542 07/12/21 1703  GLUCAP 393* 340* 324*   Lipid Profile: No results for input(s): CHOL, HDL, LDLCALC, TRIG, CHOLHDL, LDLDIRECT in the last 72 hours. Thyroid Function Tests: No results for input(s): TSH, T4TOTAL, FREET4, T3FREE, THYROIDAB in the last 72 hours. Anemia Panel: No results for input(s): VITAMINB12, FOLATE, FERRITIN, TIBC, IRON, RETICCTPCT in the last 72 hours. Urine analysis:    Component Value Date/Time   COLORURINE STRAW (A) 07/12/2021 1350   APPEARANCEUR CLEAR 07/12/2021 1350   LABSPEC 1.010 07/12/2021  1350   PHURINE 6.5 07/12/2021 1350   GLUCOSEU >1,000 (A) 07/12/2021 1350   HGBUR SMALL (A) 07/12/2021 1350   BILIRUBINUR NEGATIVE 07/12/2021 1350   KETONESUR 80 (A) 07/12/2021 1350   PROTEINUR NEGATIVE 07/12/2021 1350   NITRITE POSITIVE (A) 07/12/2021 1350   LEUKOCYTESUR NEGATIVE 07/12/2021 1350   Sepsis Labs: @LABRCNTIP (procalcitonin:4,lacticidven:4) ) Recent Results (from the past 240 hour(s))  Resp Panel by RT-PCR (Flu A&B, Covid) Nasopharyngeal Swab     Status: None   Collection Time: 07/12/21  3:33 PM   Specimen: Nasopharyngeal Swab; Nasopharyngeal(NP) swabs in vial transport medium  Result Value Ref Range Status   SARS Coronavirus 2 by RT PCR NEGATIVE NEGATIVE Final    Comment: (NOTE) SARS-CoV-2 target nucleic acids are NOT DETECTED.  The SARS-CoV-2 RNA is generally detectable in upper respiratory specimens during the acute phase of infection. The lowest concentration of SARS-CoV-2 viral copies this assay can detect is 138 copies/mL. A negative result does not preclude SARS-Cov-2 infection  and should not be used as the sole basis for treatment or other patient management decisions. A negative result may occur with  improper specimen collection/handling, submission of specimen other than nasopharyngeal swab, presence of viral mutation(s) within the areas targeted by this assay, and inadequate number of viral copies(<138 copies/mL). A negative result must be combined with clinical observations, patient history, and epidemiological information. The expected result is Negative.  Fact Sheet for Patients:  EntrepreneurPulse.com.au  Fact Sheet for Healthcare Providers:  IncredibleEmployment.be  This test is no t yet approved or cleared by the [place] FDA and  has been authorized for detection and/or diagnosis of SARS-CoV-2 by FDA under an Emergency Use Authorization (EUA). This EUA will remain  in effect (meaning this test can be used) for the duration of the COVID-19 declaration under Section 564(b)(1) of the Act, 21 U.S.C.section 360bbb-3(b)(1), unless the authorization is terminated  or revoked sooner.       Influenza A by PCR NEGATIVE NEGATIVE Final   Influenza B by PCR NEGATIVE NEGATIVE Final    Comment: (NOTE) The Xpert Xpress SARS-CoV-2/FLU/RSV plus assay is intended as an aid in the diagnosis of influenza from Nasopharyngeal swab specimens and should not be used as a sole basis for treatment. Nasal washings and aspirates are unacceptable for Xpert Xpress SARS-CoV-2/FLU/RSV testing.  Fact Sheet for Patients: EntrepreneurPulse.com.au  Fact Sheet for Healthcare Providers: IncredibleEmployment.be  This test is not yet approved or cleared by the [place] FDA and has been authorized for detection and/or diagnosis of SARS-CoV-2 by FDA under an Emergency Use Authorization (EUA). This EUA will remain in effect (meaning this test can be used) for the duration of the COVID-19 declaration  under Section 564(b)(1) of the Act, 21 U.S.C. section 360bbb-3(b)(1), unless the authorization is terminated or revoked.  Performed at [facility], [address], [place], [place] [address]      Radiological Exams on Admission: CT ABDOMEN PELVIS W CONTRAST  Result Date: 07/12/2021 CLINICAL DATA:  Acute left lower quadrant abdominal pain. EXAM: CT ABDOMEN AND PELVIS WITH CONTRAST TECHNIQUE: Multidetector CT imaging of the abdomen and pelvis was performed using the standard protocol following bolus administration of intravenous contrast. CONTRAST:  60mL OMNIPAQUE IOHEXOL 300 MG/ML  SOLN COMPARISON:  None. FINDINGS: Lower chest: No acute abnormality. Hepatobiliary: Probable hepatic steatosis. No gallstones, gallbladder wall thickening, or biliary dilatation. Pancreas: Unremarkable. No pancreatic ductal dilatation or surrounding inflammatory changes. Spleen: Normal in size without focal abnormality. Adrenals/Urinary Tract: Adrenal glands appear normal. Right  kidney and ureter are unremarkable. Minimal left hydroureteronephrosis is noted with perinephric stranding which appears to be due to focal high density area is seen in the midportion of the left ureter. It is uncertain if this represents small stone or stones or possibly blood clot. Small nonobstructive calculus is noted in lower pole collecting system of left kidney. 3 cm bladder calculus is noted in dependent portion of urinary bladder. Small portion of the urinary bladder is seen extending into moderate size right inguinal hernia. Stomach/Bowel: Stomach is within normal limits. Appendix appears normal. No evidence of bowel wall thickening, distention, or inflammatory changes. Vascular/Lymphatic: Aortic atherosclerosis. No enlarged abdominal or pelvic lymph nodes. Reproductive: Prostate is unremarkable. Other: No ascites is noted. Moderate size predominantly fat containing right inguinal hernia is noted which does contain a small portion  of the urinary bladder as detailed above. Musculoskeletal: No acute or significant osseous findings. IMPRESSION: Minimal left hydroureteronephrosis is noted with perinephric stranding which appears to be due to focal high density area seen in midportion of left ureter. It is uncertain if this represents small stone or stones or possibly blood clot. Small nonobstructive calculus is noted in lower pole collecting system of left kidney. 3 cm bladder calculus is noted in dependent portion of urinary bladder. Moderate sized right inguinal hernia is noted which contains a small portion of the urinary bladder. Probable hepatic steatosis. Aortic Atherosclerosis (ICD10-I70.0). Electronically Signed   By: Marijo Conception M.D.   On: 07/12/2021 13:04     EKG:  Not done in ED, will get one.   Assessment/Plan Principal Problem:   Complicated UTI (urinary tract infection) Active Problems:   Type II diabetes mellitus with renal manifestations (HCC)   Acute renal failure superimposed on stage 3a chronic kidney disease (HCC)   Hydroureteronephrosis_left   Severe sepsis (HCC)   HLD (hyperlipidemia)   Bladder calculus  Severe sepsis due to complicated UTI: Patient meets criteria for severe sepsis with WBC 13.4, tachycardia with heart rate 114, RR 22.  Lactic acid is elevated 2.8.  Currently hemodynamically stable.  Consulted Dr. Diamantina Providence of urology  -Admitted to [facility] bed as inpatient -IV Rocephin -Follow-up blood culture and urine culture -IV fluid: 3 L normal saline, followed 125 cc/h -will get Procalcitonin and trend lactic acid levels per sepsis protocol. -NPO after MN per Dr. Diamantina Providence  Hydroureteronephrosis_left and bladder caculus: -f/u urologist's recommendations -flomax  Type II diabetes mellitus with renal manifestations [facility]): Recent A1c 8.8, poorly controlled.  Blood sugar 393, anion gap 13.  Patient is taking metformin, Januvia, Amaryl, Lantus 14-70 unit daily -Sliding scale insulin -Glargine  insulin 12 units daily  Acute renal failure superimposed on stage 3a chronic kidney disease ([place]): Baseline creatinine 1.24 on 03/02/2021.  His creatinine is 1.71, BUN 24.  This is likely due to UTI and hydroureteronephrosis. -Avoid using renal toxic medications -IV fluid as above -Treat UTI as above -Follow-up with BMP  HLD (hyperlipidemia): -Zocor     DVT ppx: SCD Code Status: Full code Family Communication: Yes, patient's wife at bed side.  Disposition Plan:  Anticipate discharge back to previous environment Consults called:  Dr. Diamantina Providence of urology Admission status and Level of care: Med-Surg:  as inpt     Status is: Inpatient  Remains inpatient appropriate because: Patient has some multiple comorbidities, now presents with complicated UTI and sepsis. CT scan showed left hydroureteronephrosis and large bladder calculus.  Patient has worsening renal function.  His presentation is highly complicated.  Patient is at  high risk of deteriorating.  Need to be treated in hospital for at least 2 days.            Date of Service 07/12/2021    Lorretta Harp [facility]   If 7PM-7AM, please contact night-coverage [email or web address] 07/12/2021, 5:45 PM

## 2021-07-12 NOTE — Progress Notes (Signed)
CODE SEPSIS - PHARMACY COMMUNICATION  **Broad Spectrum Antibiotics should be administered within 1 hour of Sepsis diagnosis**  Time Code Sepsis Called/Page Received: 1539  Antibiotics Ordered: ceftriaxone  Time of 1st antibiotic administration: 1557    Pricilla Riffle, PharmD, BCPS Clinical Pharmacist 07/12/2021 3:57 PM

## 2021-07-12 NOTE — Progress Notes (Signed)
Elink following sepsis bundle. °

## 2021-07-12 NOTE — ED Notes (Signed)
Informed RN bed assigned 

## 2021-07-12 NOTE — ED Notes (Signed)
Pt with O2 sats dropping to 90-92% on room air while sleeping. Pt placed on O2 2 L [place], sats increased to 95%.

## 2021-07-12 NOTE — ED Provider Notes (Addendum)
[facility] Emergency Department Provider Note  ____________________________________________   Event Date/Time   First MD Initiated Contact with Patient 07/12/21 1159     (approximate)  I have reviewed the triage vital signs and the nursing notes.   HISTORY  Chief Complaint Abdominal Pain and Emesis    HPI Ryan Pugh is a 65 y.o. male with history of diabetes presents emergency department with left lower quadrant pain and vomiting x1 day.  Patient states the vomit looks and dark like there might be blood in it.  States his abdomen appears to be distended.  States always have a little bit of an abdomen but not like this.  Is complaining of pain in the abdomen.  No diarrhea.  Normal bowel movement yesterday.  Patient states he had flulike symptoms back at Thanksgiving but none now.  Denies fever  Past Medical History:  Diagnosis Date   Diabetes mellitus without complication [facility])     Patient Active Problem List   Diagnosis Date Noted   UTI (urinary tract infection) 07/12/2021     Past Surgical History:  Procedure Laterality Date   KIDNEY STONE SURGERY      Prior to Admission medications   Medication Sig Start Date End Date Taking? Authorizing Provider  glimepiride (AMARYL) 4 MG tablet Take 1 tablet by mouth 2 (two) times daily. 09/30/17  Yes [provider]  insulin glargine (LANTUS SOLOSTAR) 100 UNIT/ML Solostar Pen Inject 17-20 Units into the skin daily. 03/22/16  Yes [provider]  levofloxacin (LEVAQUIN) 500 MG tablet Take 1 tablet (500 mg total) by mouth daily for 10 days. 07/12/21 07/22/21 Yes Audrielle Vankuren, Roselyn Bering, PA-C  ondansetron (ZOFRAN-ODT) 4 MG disintegrating tablet Take 1 tablet (4 mg total) by mouth every 8 (eight) hours as needed. 07/12/21  Yes Bentli Llorente, Roselyn Bering, PA-C  oxyCODONE-acetaminophen (PERCOCET) 7.5-325 MG tablet Take 1 tablet by mouth every 4 (four) hours as needed for severe pain. 07/12/21 07/12/22 Yes Zalika Tieszen, Roselyn Bering, PA-C  tadalafil (CIALIS) 5 MG tablet Take 1 tablet by mouth as directed. 08/29/15  Yes [provider]  tamsulosin (FLOMAX) 0.4 MG CAPS capsule Take 1 capsule (0.4 mg total) by mouth daily. 07/12/21  Yes Bray Vickerman, Roselyn Bering, PA-C  tamsulosin (FLOMAX) 0.4 MG CAPS capsule Take 1 capsule by mouth daily. 01/15/17  Yes [provider]  venlafaxine XR (EFFEXOR-XR) 75 MG 24 hr capsule Take 1 capsule by mouth daily. 10/16/17  Yes [provider]  Aspirin 81 MG CAPS Take 1 tablet by mouth daily.    [provider]  cetirizine (ZYRTEC) 10 MG tablet Take 1 tablet by mouth daily.    [provider]  JANUVIA 100 MG tablet Take 100 mg by mouth daily. 04/24/21   [provider]  metFORMIN (GLUCOPHAGE) 500 MG tablet Take 1,000 mg by mouth 2 (two) times daily. 04/24/21   [provider]  simvastatin (ZOCOR) 40 MG tablet Take 40 mg by mouth at bedtime. 04/24/21   [provider]    Allergies Patient has no known allergies.  No family history on file.  Social History Social History   Tobacco Use   Smoking status: Never   Smokeless tobacco: Never  Substance Use Topics   Alcohol use: No   Drug use: No    Review of Systems  Constitutional: No fever/chills Eyes: No visual changes. ENT: No sore throat. Respiratory: Denies cough Cardiovascular: Denies chest pain Gastrointestinal: Positive abdominal pain Genitourinary: Negative for dysuria. Musculoskeletal: Negative for  back pain. Skin: Negative for rash. Psychiatric: no mood changes,     ____________________________________________   PHYSICAL EXAM:  VITAL SIGNS: ED Triage Vitals  Enc Vitals Group     BP 07/12/21 1121 (!) 158/85     Pulse Rate 07/12/21 1121 87     Resp 07/12/21 1121 18     Temp 07/12/21 1121 98.9 F (37.2 C)     Temp Source 07/12/21 1121 Oral     SpO2 07/12/21 1121 94 %     Weight 07/12/21 1123 184 lb (83.5 kg)     Height 07/12/21 1123 5\' 9"  (1.753 m)      Head Circumference --      Peak Flow --      Pain Score 07/12/21 1123 10     Pain Loc --      Pain Edu? --      Excl. in GC? --     Constitutional: Alert and oriented. Well appearing and in no acute distress. Eyes: Conjunctivae are normal.  Head: Atraumatic. Nose: No congestion/rhinnorhea. Mouth/Throat: Mucous membranes are moist.   Neck:  supple no lymphadenopathy noted Cardiovascular: Normal rate, regular rhythm. Heart sounds are normal Respiratory: Normal respiratory effort.  No retractions, lungs c t a  Abd: Slightly distended, soft tender in the left quadrants, bs decreased all 4 quad GU: deferred Musculoskeletal: FROM all extremities, warm and well perfused Neurologic:  Normal speech and language.  Skin:  Skin is warm, dry and intact. No rash noted. Psychiatric: Mood and affect are normal. Speech and behavior are normal.  ____________________________________________   LABS (all labs ordered are listed, but only abnormal results are displayed)  Labs Reviewed  COMPREHENSIVE METABOLIC PANEL - Abnormal; Notable for the following components:      Result Value   Sodium 132 (*)    CO2 18 (*)    Glucose, Bld 425 (*)    BUN 24 (*)    Creatinine, Ser 1.71 (*)    Total Bilirubin 1.5 (*)    GFR, Estimated 44 (*)    All other components within normal limits  CBC - Abnormal; Notable for the following components:   WBC 13.2 (*)    MCHC 36.3 (*)    All other components within normal limits  URINALYSIS, ROUTINE W REFLEX MICROSCOPIC - Abnormal; Notable for the following components:   Color, Urine STRAW (*)    Glucose, UA >1,000 (*)    Hgb urine dipstick SMALL (*)    Ketones, ur 80 (*)    Nitrite POSITIVE (*)    All other components within normal limits  URINALYSIS, MICROSCOPIC (REFLEX) - Abnormal; Notable for the following components:   Bacteria, UA RARE (*)    All other components within normal limits  CBG MONITORING, ED - Abnormal; Notable for the following components:    Glucose-Capillary 393 (*)    All other components within normal limits  RESP PANEL BY RT-PCR (FLU A&B, COVID) [id number]  CULTURE, BLOOD (ROUTINE X 2)  CULTURE, BLOOD (ROUTINE X 2)  URINE CULTURE  LIPASE, BLOOD   ____________________________________________   ____________________________________________  RADIOLOGY  CT abdomen/pelvis IV contrast  ____________________________________________   PROCEDURES  Procedure(s) performed: No  Procedures    ____________________________________________   INITIAL IMPRESSION / ASSESSMENT AND PLAN / ED COURSE  Pertinent labs & imaging results that were available during my care of the patient were reviewed by me and considered in my medical decision making (see chart for details).   The patient is a 65 year old male  presents emergency department with abdominal pain and vomiting.  See HPI.  Physical exam shows patient be stable.  DDx: Gastroenteritis, GI bleed, diverticulitis, bowel obstruction, kidney stone  Labs show elevated WBC of 13.2, comprehensive metabolic panel shows glucose to be elevated 425 with elevations in his BUN and creatinine, anion gap is normal, sodium is a little decreased at 132.  After fluids glucose did decrease to 393 lipase is normal.  Urinalysis shows greater than thousand glucose, small amount hemoglobin 80 ketones positive nitrites and moderate bacteria.  We will add a urine culture.  CT abdomen/pelvis with IV contrast shows kidney stone 3 6 cm in the bladder along with questionable blood clot versus stone in the mid ureter.  This is reviewed by me and confirmed by radiology  Consult to urology.  Dr. Signa Kell states do not give Toradol due to kidney functions.  States he should have a follow-up in the office.  He will have his office call for an appointment.    Patient was given multiple dose pain medication, 2 bags of fluid.  Nausea medication.  States he is feeling better.  Still does have pain secondary to the  kidney stone.  However do not feel patient needs to be admitted does not appear that the kidney stone is infected and patient is improving with pain medication and fluids.  He and his wife are agreeable for discharge.   Patient's heart rate continues to stay elevated.  In discussion with Dr. Cyril Loosen, with the patient having elevated white count, nephrotic stranding, nitrites in his urine, and being diabetic with a much elevated glucose feels that he should be admitted.  Patient and his wife are agreeable for admission.  Consult hospitalist  Notified urology that he will be admitted so they can consult on the floor   Dr Zane Herald to admit  Ryan Pugh was evaluated in Emergency Department on 07/12/2021 for the symptoms described in the history of present illness. He was evaluated in the context of the global COVID-19 pandemic, which necessitated consideration that the patient might be at risk for infection with the SARS-CoV-2 virus that causes COVID-19. Institutional protocols and algorithms that pertain to the evaluation of patients at risk for COVID-19 are in a state of rapid change based on information released by regulatory bodies including the CDC and federal and state organizations. These policies and algorithms were followed during the patient's care in the ED.    As part of my medical decision making, I reviewed the following data within the electronic MEDICAL RECORD NUMBER History obtained from family, Nursing notes reviewed and incorporated, Labs reviewed , Old chart reviewed, Radiograph reviewed , A consult was requested and obtained from this/these consultant(s) Urology, Notes from prior ED visits, and [place] Controlled Substance Database  ____________________________________________   FINAL CLINICAL IMPRESSION(S) / ED DIAGNOSES  Final diagnoses:  Kidney stone  Tachycardia  Pain      NEW MEDICATIONS STARTED DURING THIS VISIT:  New Prescriptions   LEVOFLOXACIN (LEVAQUIN) 500 MG TABLET     Take 1 tablet (500 mg total) by mouth daily for 10 days.   ONDANSETRON (ZOFRAN-ODT) 4 MG DISINTEGRATING TABLET    Take 1 tablet (4 mg total) by mouth every 8 (eight) hours as needed.   OXYCODONE-ACETAMINOPHEN (PERCOCET) 7.5-325 MG TABLET    Take 1 tablet by mouth every 4 (four) hours as needed for severe pain.   TAMSULOSIN (FLOMAX) 0.4 MG CAPS CAPSULE    Take 1 capsule (0.4 mg total) by mouth daily.  Note:  This document was prepared using Dragon voice recognition software and may include unintentional dictation errors.    Faythe Ghee, PA-C 07/12/21 1457    Jene Every, MD 07/12/21 1509    Faythe Ghee, PA-C 07/12/21 1538    Jene Every, MD 07/12/21 [phone]

## 2021-07-13 ENCOUNTER — Encounter
Admission: EM | Disposition: A | Discharge status: Home / Self Care | Payer: Self-pay | Source: Home / Self Care | Attending: Internal Medicine

## 2021-07-13 ENCOUNTER — Inpatient Hospital Stay: Payer: Managed Care, Other (non HMO) | Admitting: Anesthesiology

## 2021-07-13 ENCOUNTER — Encounter: Payer: Self-pay | Admitting: Internal Medicine

## 2021-07-13 ENCOUNTER — Inpatient Hospital Stay: Payer: Managed Care, Other (non HMO)

## 2021-07-13 DIAGNOSIS — R7881 Bacteremia: Secondary | ICD-10-CM

## 2021-07-13 HISTORY — PX: CYSTOSCOPY W/ URETERAL STENT PLACEMENT: SHX1429

## 2021-07-13 LAB — BLOOD CULTURE ID PANEL (REFLEXED) - BCID2

## 2021-07-13 LAB — GLUCOSE, CAPILLARY
Glucose-Capillary: 269 mg/dL — ABNORMAL HIGH (ref 70–99)
Glucose-Capillary: 209 mg/dL — ABNORMAL HIGH (ref 70–99)
Glucose-Capillary: 206 mg/dL — ABNORMAL HIGH (ref 70–99)
Glucose-Capillary: 235 mg/dL — ABNORMAL HIGH (ref 70–99)
Glucose-Capillary: 234 mg/dL — ABNORMAL HIGH (ref 70–99)

## 2021-07-13 LAB — BASIC METABOLIC PANEL
Anion gap: 8 (ref 5–15)
BUN: 27 mg/dL — ABNORMAL HIGH (ref 8–23)
CO2: 23 mmol/L (ref 22–32)
Calcium: 8.3 mg/dL — ABNORMAL LOW (ref 8.9–10.3)
Chloride: 106 mmol/L (ref 98–111)
Creatinine, Ser: 2.18 mg/dL — ABNORMAL HIGH (ref 0.61–1.24)
GFR, Estimated: 33 mL/min — ABNORMAL LOW (ref >60)
Glucose, Bld: 288 mg/dL — ABNORMAL HIGH (ref 70–99)
Potassium: 4.3 mmol/L (ref 3.5–5.1)
Sodium: 137 mmol/L (ref 135–145)

## 2021-07-13 LAB — CBC
HCT: 39.9 % (ref 39.0–52.0)
Hemoglobin: 13.8 g/dL (ref 13.0–17.0)
MCH: 29.7 pg (ref 26.0–34.0)
MCHC: 34.6 g/dL (ref 30.0–36.0)
MCV: 85.8 fL (ref 80.0–100.0)
Platelets: 264 10*3/uL (ref 150–400)
RBC: 4.65 MIL/uL (ref 4.22–5.81)
RDW: 12.9 % (ref 11.5–15.5)
WBC: 12.6 10*3/uL — ABNORMAL HIGH (ref 4.0–10.5)
nRBC: 0 % (ref 0.0–0.2)

## 2021-07-13 SURGERY — CYSTOSCOPY, WITH RETROGRADE PYELOGRAM AND URETERAL STENT INSERTION
Anesthesia: General | Laterality: Left

## 2021-07-13 MED ORDER — METOCLOPRAMIDE HCL 5 MG/ML IJ SOLN
10.0000 mg | Freq: Once | INTRAMUSCULAR | Status: AC
  Administered 2021-07-13: 10 mg via INTRAVENOUS
  Filled 2021-07-13: qty 2

## 2021-07-13 MED ORDER — MORPHINE SULFATE (PF) 2 MG/ML IV SOLN: 2.0000 mg | Freq: Once | INTRAVENOUS | Status: DC

## 2021-07-13 MED ORDER — TAMSULOSIN HCL 0.4 MG PO CAPS
0.4000 mg | ORAL_CAPSULE | Freq: Every day | ORAL | Status: DC
  Administered 2021-07-13 – 2021-07-15 (×3): 0.4 mg via ORAL
  Filled 2021-07-13 (×4): qty 1

## 2021-07-13 MED ORDER — ASPIRIN EC 81 MG PO TBEC
81.0000 mg | DELAYED_RELEASE_TABLET | Freq: Every day | ORAL | Status: DC
  Administered 2021-07-14 – 2021-07-15 (×2): 81 mg via ORAL
  Filled 2021-07-13 (×3): qty 1

## 2021-07-13 MED ORDER — DEXAMETHASONE SODIUM PHOSPHATE 10 MG/ML IJ SOLN
INTRAMUSCULAR | Status: DC | PRN
  Administered 2021-07-13: 10 mg via INTRAVENOUS

## 2021-07-13 MED ORDER — IOHEXOL 180 MG/ML  SOLN
INTRAMUSCULAR | Status: DC | PRN
  Administered 2021-07-13: 10 mL

## 2021-07-13 MED ORDER — ACETAMINOPHEN 10 MG/ML IV SOLN
INTRAVENOUS | Status: AC
  Filled 2021-07-13: qty 100

## 2021-07-13 MED ORDER — PHENYLEPHRINE HCL (PRESSORS) 10 MG/ML IV SOLN
INTRAVENOUS | Status: DC | PRN
  Administered 2021-07-13 (×3): 160 ug via INTRAVENOUS

## 2021-07-13 MED ORDER — SODIUM CHLORIDE FLUSH 0.9 % IV SOLN
INTRAVENOUS | Status: AC
  Filled 2021-07-13: qty 10

## 2021-07-13 MED ORDER — OXYCODONE-ACETAMINOPHEN 5-325 MG PO TABS: 2.0000 | ORAL_TABLET | ORAL | Status: DC | PRN

## 2021-07-13 MED ORDER — LORATADINE 10 MG PO TABS
10.0000 mg | ORAL_TABLET | Freq: Every day | ORAL | Status: DC
  Administered 2021-07-14 – 2021-07-15 (×2): 10 mg via ORAL
  Filled 2021-07-13 (×3): qty 1

## 2021-07-13 MED ORDER — SIMVASTATIN 40 MG PO TABS
40.0000 mg | ORAL_TABLET | Freq: Every day | ORAL | Status: DC
  Administered 2021-07-13 – 2021-07-14 (×2): 40 mg via ORAL
  Filled 2021-07-13 (×3): qty 1

## 2021-07-13 MED ORDER — FENTANYL CITRATE (PF) 100 MCG/2ML IJ SOLN
INTRAMUSCULAR | Status: AC
  Filled 2021-07-13: qty 2

## 2021-07-13 MED ORDER — KETOROLAC TROMETHAMINE 30 MG/ML IJ SOLN
INTRAMUSCULAR | Status: AC
  Filled 2021-07-13: qty 1

## 2021-07-13 MED ORDER — SODIUM CHLORIDE 0.9 % IR SOLN
Status: DC | PRN
  Administered 2021-07-13: 1000 mL via INTRAVESICAL

## 2021-07-13 MED ORDER — ACETAMINOPHEN 10 MG/ML IV SOLN
INTRAVENOUS | Status: DC | PRN
  Administered 2021-07-13: 1000 mg via INTRAVENOUS

## 2021-07-13 MED ORDER — MIDAZOLAM HCL 2 MG/2ML IJ SOLN
INTRAMUSCULAR | Status: AC
  Filled 2021-07-13: qty 2

## 2021-07-13 MED ORDER — MORPHINE SULFATE (PF) 2 MG/ML IV SOLN: 2.0000 mg | INTRAVENOUS | Status: DC | PRN

## 2021-07-13 MED ORDER — LIDOCAINE HCL (CARDIAC) PF 100 MG/5ML IV SOSY
PREFILLED_SYRINGE | INTRAVENOUS | Status: DC | PRN
  Administered 2021-07-13: 100 mg via INTRAVENOUS

## 2021-07-13 MED ORDER — PROPOFOL 10 MG/ML IV BOLUS
INTRAVENOUS | Status: DC | PRN
  Administered 2021-07-13: 150 mg via INTRAVENOUS

## 2021-07-13 MED ORDER — MORPHINE SULFATE (PF) 2 MG/ML IV SOLN
2.0000 mg | INTRAVENOUS | Status: DC | PRN
  Administered 2021-07-13: 2 mg via INTRAVENOUS
  Filled 2021-07-13: qty 1

## 2021-07-13 MED ORDER — ONDANSETRON HCL 4 MG/2ML IJ SOLN: 4.0000 mg | Freq: Once | INTRAMUSCULAR | Status: DC | PRN

## 2021-07-13 MED ORDER — PROPOFOL 10 MG/ML IV BOLUS
INTRAVENOUS | Status: AC
  Filled 2021-07-13: qty 20

## 2021-07-13 MED ORDER — ONDANSETRON HCL 4 MG/2ML IJ SOLN
INTRAMUSCULAR | Status: DC | PRN
  Administered 2021-07-13: 4 mg via INTRAVENOUS

## 2021-07-13 MED ORDER — LIDOCAINE HCL (PF) 2 % IJ SOLN
INTRAMUSCULAR | Status: AC
  Filled 2021-07-13: qty 5

## 2021-07-13 MED ORDER — FENTANYL CITRATE (PF) 100 MCG/2ML IJ SOLN
INTRAMUSCULAR | Status: DC | PRN
  Administered 2021-07-13 (×2): 25 ug via INTRAVENOUS

## 2021-07-13 MED ORDER — PROMETHAZINE HCL 25 MG PO TABS: 25.0000 mg | ORAL_TABLET | Freq: Four times a day (QID) | ORAL | Status: DC | PRN

## 2021-07-13 MED ORDER — SODIUM CHLORIDE 0.9 % IV SOLN
2.0000 g | INTRAVENOUS | Status: DC
  Administered 2021-07-13: 2 g via INTRAVENOUS
  Filled 2021-07-13: qty 20
  Filled 2021-07-13: qty 2

## 2021-07-13 MED ORDER — FENTANYL CITRATE (PF) 100 MCG/2ML IJ SOLN: 25.0000 ug | INTRAMUSCULAR | Status: DC | PRN

## 2021-07-13 MED ORDER — CHLORHEXIDINE GLUCONATE CLOTH 2 % EX PADS
6.0000 | MEDICATED_PAD | Freq: Every day | CUTANEOUS | Status: DC
  Administered 2021-07-13 – 2021-07-15 (×3): 6 via TOPICAL

## 2021-07-13 SURGICAL SUPPLY — 32 items
BAG DRAIN CYSTO-URO LG1000N (MISCELLANEOUS) ×2 IMPLANT
BRUSH SCRUB EZ 1% IODOPHOR (MISCELLANEOUS) ×2 IMPLANT
CATH FOL 2WAY LX 18X30 (CATHETERS) ×1 IMPLANT
CATH URET FLEX-TIP 2 LUMEN 10F (CATHETERS) IMPLANT
CATH URETL OPEN 5X70 (CATHETERS) ×1 IMPLANT
CNTNR SPEC 2.5X3XGRAD LEK (MISCELLANEOUS) ×1
CONT SPEC 4OZ STER OR WHT (MISCELLANEOUS) ×1
CONTAINER SPEC 2.5X3XGRAD LEK (MISCELLANEOUS) IMPLANT
DRAPE UTILITY 15X26 TOWEL STRL (DRAPES) ×2 IMPLANT
DRSG TEGADERM 2-3/8X2-3/4 SM (GAUZE/BANDAGES/DRESSINGS) ×2 IMPLANT
GAUZE 4X4 16PLY ~~LOC~~+RFID DBL (SPONGE) ×4 IMPLANT
GLOVE SURG UNDER POLY LF SZ7.5 (GLOVE) ×2 IMPLANT
GOWN STRL REUS W/ TWL LRG LVL3 (GOWN DISPOSABLE) ×1 IMPLANT
GOWN STRL REUS W/ TWL XL LVL3 (GOWN DISPOSABLE) ×1 IMPLANT
GOWN STRL REUS W/TWL LRG LVL3 (GOWN DISPOSABLE) ×1
GOWN STRL REUS W/TWL XL LVL3 (GOWN DISPOSABLE) ×1
GUIDEWIRE STR DUAL SENSOR (WIRE) ×2 IMPLANT
HOLDER FOLEY CATH W/STRAP (MISCELLANEOUS) ×1 IMPLANT
INFUSOR MANOMETER BAG 3000ML (MISCELLANEOUS) ×2 IMPLANT
IV NS IRRIG 3000ML ARTHROMATIC (IV SOLUTION) ×2 IMPLANT
KIT TURNOVER CYSTO (KITS) ×2 IMPLANT
PACK CYSTO AR (MISCELLANEOUS) ×2 IMPLANT
SET CYSTO W/LG BORE CLAMP LF (SET/KITS/TRAYS/PACK) ×2 IMPLANT
SHEATH URETERAL 12FRX35CM (MISCELLANEOUS) IMPLANT
STENT URET 6FRX24 CONTOUR (STENTS) IMPLANT
STENT URET 6FRX26 CONTOUR (STENTS) IMPLANT
SURGILUBE 2OZ TUBE FLIPTOP (MISCELLANEOUS) ×2 IMPLANT
SYR 10ML LL (SYRINGE) ×2 IMPLANT
TRACTIP FLEXIVA PULSE ID 200 (Laser) IMPLANT
VALVE UROSEAL ADJ ENDO (VALVE) IMPLANT
WATER STERILE IRR 1000ML POUR (IV SOLUTION) ×2 IMPLANT
WATER STERILE IRR 500ML POUR (IV SOLUTION) ×2 IMPLANT

## 2021-07-13 NOTE — Progress Notes (Signed)
PROGRESS NOTE    Ryan Pugh  XBD:[id number] DOB: Jun 09, 1956 DOA: 07/12/2021 PCP: Su Grand, MD  Assessment & Plan:   Principal Problem:   Complicated UTI (urinary tract infection) Active Problems:   Type II diabetes mellitus with renal manifestations ([place])   Acute renal failure superimposed on stage 3a chronic kidney disease (HCC)   Hydroureteronephrosis_left   Severe sepsis (HCC)   HLD (hyperlipidemia)   Bladder calculus     Severe sepsis: met criteria w/ leukocytosis, tachycardia, tachypnea, elevated lactic acid & secondary to complicated UTI.  Continue on IVFs. Continue on IV rocephin   UTI: urine cx is pending. Continue on IV rocephin   Hydroureteronephrosis: likely secondary to calculus. Continue on flomax. S/p cystoscopy w/ left ureteral stent placement. Uro recs apprec   Possible bacteremia: blood cxs growing gram positive cocci, sens pending. Will repeat blood cxs tomorrow. Continue on IV rocephin    AKI on CKDIIIa: baseline Cr around 1.24. Likely secondary to above. Continue on IVFs. Avoid nephrotoxic meds   DM2: poorly controlled, HbA1c 8.8.  Continue on glargine, SSI w/ accuchecks   HLD: continue on statin     DVT prophylaxis: SCDs Code Status:  full  Family Communication:  Disposition Plan: likely d/c back home   Level of care: Med-Surg  Status is: Inpatient  Remains inpatient appropriate because: severity of illness, requiring IV abxs     Consultants:  Urology   Procedures: s/p cystoscopy w/ stent placement    Antimicrobials: rocephin    Subjective: Pt c/o chills & flank pain   Objective: Vitals:   07/12/21 1900 07/12/21 2000 07/12/21 2113 07/13/21 0306  BP:  (!) 169/83 (!) 166/92 (!) 165/77  Pulse: (!) 107 (!) 107 (!) 109 99  Resp: 16 18 20 18   Temp:  99 F (37.2 C) 98.6 F (37 C) 98.8 F (37.1 C)  TempSrc:  Oral Oral Oral  SpO2: 97% 96% 98% 98%  Weight:      Height:        Intake/Output Summary (Last 24 hours) at  07/13/2021 0748 Last data filed at 07/13/2021 0328 Gross per 24 hour  Intake 1829.83 ml  Output 800 ml  Net 1029.83 ml   Filed Weights   07/12/21 1123  Weight: 83.5 kg    Examination:  General exam: Appears calm but  uncomfortable  Respiratory system: Clear to auscultation. Respiratory effort normal. Cardiovascular system: S1 & S2 +. No rubs, gallops or clicks.  Gastrointestinal system: Abdomen is nondistended, soft and nontender. Normal bowel sounds heard. Central nervous system: Alert and oriented. Moves all extremities  Psychiatry: Judgement and insight appear normal. Flat mood and affect     Data Reviewed: I have personally reviewed following labs and imaging studies  CBC: Recent Labs  Lab 07/12/21 1030 07/12/21 1553 07/13/21 0421  WBC 13.2* 14.3* 12.6*  NEUTROABS  --  13.3*  --   HGB 15.4 14.1 13.8  HCT 42.4 40.7 39.9  MCV 85.5 87.2 85.8  PLT 280 281 834   Basic Metabolic Panel: Recent Labs  Lab 07/12/21 1030 07/12/21 1553 07/13/21 0421  NA 132* 133* 137  K 4.3 4.5 4.3  CL 101 105 106  CO2 18* 20* 23  GLUCOSE 425* 366* 288*  BUN 24* 21 27*  CREATININE 1.71* 1.76* 2.18*  CALCIUM 9.3 8.1* 8.3*   GFR: Estimated Creatinine Clearance: 34.7 mL/min (A) (by C-G formula based on SCr of 2.18 mg/dL (H)). Liver Function Tests: Recent Labs  Lab 07/12/21 1030 07/12/21 1553  AST 26 20  ALT 26 24  ALKPHOS 107 88  BILITOT 1.5* 0.9  PROT 7.5 6.3*  ALBUMIN 4.6 3.6   Recent Labs  Lab 07/12/21 1030  LIPASE 41   No results for input(s): AMMONIA in the last 168 hours. Coagulation Profile: Recent Labs  Lab 07/12/21 1628  INR 1.1   Cardiac Enzymes: No results for input(s): CKTOTAL, CKMB, CKMBINDEX, TROPONINI in the last 168 hours. BNP (last 3 results) No results for input(s): PROBNP in the last 8760 hours. HbA1C: No results for input(s): HGBA1C in the last 72 hours. CBG: Recent Labs  Lab 07/12/21 1542 07/12/21 1703 07/12/21 2008 07/12/21 2120  07/13/21 0747  GLUCAP 340* 324* 382* 349* 269*   Lipid Profile: No results for input(s): CHOL, HDL, LDLCALC, TRIG, CHOLHDL, LDLDIRECT in the last 72 hours. Thyroid Function Tests: No results for input(s): TSH, T4TOTAL, FREET4, T3FREE, THYROIDAB in the last 72 hours. Anemia Panel: No results for input(s): VITAMINB12, FOLATE, FERRITIN, TIBC, IRON, RETICCTPCT in the last 72 hours. Sepsis Labs: Recent Labs  Lab 07/12/21 1553 07/12/21 1758  LATICACIDVEN 2.8* 3.0*    Recent Results (from the past 240 hour(s))  Resp Panel by RT-PCR (Flu A&B, Covid) Nasopharyngeal Swab     Status: None   Collection Time: 07/12/21  3:33 PM   Specimen: Nasopharyngeal Swab; Nasopharyngeal(NP) swabs in vial transport medium  Result Value Ref Range Status   SARS Coronavirus 2 by RT PCR NEGATIVE NEGATIVE Final    Comment: (NOTE) SARS-CoV-2 target nucleic acids are NOT DETECTED.  The SARS-CoV-2 RNA is generally detectable in upper respiratory specimens during the acute phase of infection. The lowest concentration of SARS-CoV-2 viral copies this assay can detect is 138 copies/mL. A negative result does not preclude SARS-Cov-2 infection and should not be used as the sole basis for treatment or other patient management decisions. A negative result may occur with  improper specimen collection/handling, submission of specimen other than nasopharyngeal swab, presence of viral mutation(s) within the areas targeted by this assay, and inadequate number of viral copies(<138 copies/mL). A negative result must be combined with clinical observations, patient history, and epidemiological information. The expected result is Negative.  Fact Sheet for Patients:  EntrepreneurPulse.com.au  Fact Sheet for Healthcare Providers:  IncredibleEmployment.be  This test is no t yet approved or cleared by the [place] FDA and  has been authorized for detection and/or diagnosis of SARS-CoV-2  by FDA under an Emergency Use Authorization (EUA). This EUA will remain  in effect (meaning this test can be used) for the duration of the COVID-19 declaration under Section 564(b)(1) of the Act, 21 U.S.C.section 360bbb-3(b)(1), unless the authorization is terminated  or revoked sooner.       Influenza A by PCR NEGATIVE NEGATIVE Final   Influenza B by PCR NEGATIVE NEGATIVE Final    Comment: (NOTE) The Xpert Xpress SARS-CoV-2/FLU/RSV plus assay is intended as an aid in the diagnosis of influenza from Nasopharyngeal swab specimens and should not be used as a sole basis for treatment. Nasal washings and aspirates are unacceptable for Xpert Xpress SARS-CoV-2/FLU/RSV testing.  Fact Sheet for Patients: EntrepreneurPulse.com.au  Fact Sheet for Healthcare Providers: IncredibleEmployment.be  This test is not yet approved or cleared by the [place] FDA and has been authorized for detection and/or diagnosis of SARS-CoV-2 by FDA under an Emergency Use Authorization (EUA). This EUA will remain in effect (meaning this test can be used) for the duration of the COVID-19 declaration under Section 564(b)(1) of the Act, 21  U.S.C. section 360bbb-3(b)(1), unless the authorization is terminated or revoked.  Performed at [facility], [address] [place], [place] [address]          Radiology Studies: CT ABDOMEN PELVIS W CONTRAST  Result Date: 07/12/2021 CLINICAL DATA:  Acute left lower quadrant abdominal pain. EXAM: CT ABDOMEN AND PELVIS WITH CONTRAST TECHNIQUE: Multidetector CT imaging of the abdomen and pelvis was performed using the standard protocol following bolus administration of intravenous contrast. CONTRAST:  59m OMNIPAQUE IOHEXOL 300 MG/ML  SOLN COMPARISON:  None. FINDINGS: Lower chest: No acute abnormality. Hepatobiliary: Probable hepatic steatosis. No gallstones, gallbladder wall thickening, or biliary dilatation. Pancreas:  Unremarkable. No pancreatic ductal dilatation or surrounding inflammatory changes. Spleen: Normal in size without focal abnormality. Adrenals/Urinary Tract: Adrenal glands appear normal. Right kidney and ureter are unremarkable. Minimal left hydroureteronephrosis is noted with perinephric stranding which appears to be due to focal high density area is seen in the midportion of the left ureter. It is uncertain if this represents small stone or stones or possibly blood clot. Small nonobstructive calculus is noted in lower pole collecting system of left kidney. 3 cm bladder calculus is noted in dependent portion of urinary bladder. Small portion of the urinary bladder is seen extending into moderate size right inguinal hernia. Stomach/Bowel: Stomach is within normal limits. Appendix appears normal. No evidence of bowel wall thickening, distention, or inflammatory changes. Vascular/Lymphatic: Aortic atherosclerosis. No enlarged abdominal or pelvic lymph nodes. Reproductive: Prostate is unremarkable. Other: No ascites is noted. Moderate size predominantly fat containing right inguinal hernia is noted which does contain a small portion of the urinary bladder as detailed above. Musculoskeletal: No acute or significant osseous findings. IMPRESSION: Minimal left hydroureteronephrosis is noted with perinephric stranding which appears to be due to focal high density area seen in midportion of left ureter. It is uncertain if this represents small stone or stones or possibly blood clot. Small nonobstructive calculus is noted in lower pole collecting system of left kidney. 3 cm bladder calculus is noted in dependent portion of urinary bladder. Moderate sized right inguinal hernia is noted which contains a small portion of the urinary bladder. Probable hepatic steatosis. Aortic Atherosclerosis (ICD10-I70.0). Electronically Signed   By: JMarijo ConceptionM.D.   On: 07/12/2021 13:04        Scheduled Meds:  aspirin EC  81 mg  Oral Daily   insulin aspart  0-5 Units Subcutaneous QHS   insulin aspart  0-9 Units Subcutaneous TID WC   insulin glargine-yfgn  12 Units Subcutaneous Daily   loratadine  10 mg Oral Daily   metoCLOPramide (REGLAN) injection  10 mg Intravenous Once   simvastatin  40 mg Oral QHS   tamsulosin  0.4 mg Oral Daily   Continuous Infusions:  sodium chloride 125 mL/hr at 07/13/21 0532   cefTRIAXone (ROCEPHIN)  IV       LOS: 1 day    Time spent: 349mins     JWyvonnia Dusky MD [facility] Pager [phone]  If 7PM-7AM, please contact night-coverage 07/13/2021, 7:48 AM

## 2021-07-13 NOTE — Progress Notes (Addendum)
° °  Subjective Afebrile overnight Continues to have severe left flank pain and nausea  Physical Exam: BP (!) 162/84 (BP Location: Right Arm)    Pulse 97    Temp 97.6 F (36.4 C) (Oral)    Resp 20    Ht 5\' 9"  (1.753 m)    Wt 83.5 kg    SpO2 99%    BMI 27.17 kg/m    Constitutional: Appears very uncomfortable Cardiac: Tachycardic, regular rhythm Respiratory: Clear to auscultation bilaterally   Laboratory Data: Reviewed in epic Lactate 3 yesterday Creatinine 2.18 from 1.76 WBC 12.6 from 14.3   Assessment & Plan:   65 year old male who presented yesterday with severe left flank pain of unclear etiology with minimal left-sided hydroureteronephrosis and some subtle enhancement in the mid ureter, but no ureteral stones.  There was also a 3 cm bladder stone and a nondistended bladder.  Urinalysis was relatively benign, however he had persistent tachycardia, leukocytosis with left shift, and elevated lactate consistent with possible pyelonephritis.  In the setting of his persistent left-sided severe flank pain and nausea, I recommended proceeding to the operating room today for cystoscopy and left ureteral stent placement.  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  Continue antibiotics OR this morning for cystoscopy and left ureteral stent placement  66, MD

## 2021-07-13 NOTE — Anesthesia Preprocedure Evaluation (Signed)
Anesthesia Evaluation  Patient identified by MRN, date of birth, ID band Patient awake    Reviewed: Allergy & Precautions, NPO status , Patient's Chart, lab work & pertinent test results  Airway Mallampati: III  TM Distance: >3 FB Neck ROM: full  Mouth opening: Limited Mouth Opening  Dental  (+) Teeth Intact, Loose,    Pulmonary neg pulmonary ROS,    Pulmonary exam normal  + decreased breath sounds      Cardiovascular Exercise Tolerance: Good negative cardio ROS Normal cardiovascular exam Rhythm:Regular     Neuro/Psych Anxiety negative neurological ROS  negative psych ROS   GI/Hepatic negative GI ROS, Neg liver ROS,   Endo/Other  negative endocrine ROSdiabetes, Poorly Controlled, Type 1, Oral Hypoglycemic Agents, Insulin Dependent  Renal/GU Renal disease     Musculoskeletal negative musculoskeletal ROS (+)   Abdominal (+) + obese,   Peds negative pediatric ROS (+)  Hematology negative hematology ROS (+)   Anesthesia Other Findings Past Medical History: No date: CKD (chronic kidney disease), stage III (HCC) No date: Diabetes mellitus without complication (HCC) No date: HLD (hyperlipidemia) No date: Kidney stone  Past Surgical History: No date: KIDNEY STONE SURGERY  BMI    Body Mass Index: 27.17 kg/m      Reproductive/Obstetrics negative OB ROS                             Anesthesia Physical Anesthesia Plan  ASA: 3  Anesthesia Plan: General   Post-op Pain Management:    Induction: Intravenous  PONV Risk Score and Plan: Ondansetron, Dexamethasone, Midazolam and Treatment may vary due to age or medical condition  Airway Management Planned: Oral ETT  Additional Equipment:   Intra-op Plan:   Post-operative Plan: Extubation in OR  Informed Consent: I have reviewed the patients History and Physical, chart, labs and discussed the procedure including the risks, benefits and  alternatives for the proposed anesthesia with the patient or authorized representative who has indicated his/her understanding and acceptance.     Dental Advisory Given  Plan Discussed with: CRNA and Surgeon  Anesthesia Plan Comments:         Anesthesia Quick Evaluation

## 2021-07-13 NOTE — Transfer of Care (Signed)
Immediate Anesthesia Transfer of Care Note  Patient: Ryan Pugh  Procedure(s) Performed: Procedure(s): CYSTOSCOPY WITH RETROGRADE PYELOGRAM/URETERAL STENT PLACEMENT (Left)  Patient Location: PACU  Anesthesia Type:General  Level of Consciousness: sedated  Airway & Oxygen Therapy: Patient Spontanous Breathing and Patient connected to face mask oxygen  Post-op Assessment: Report given to RN and Post -op Vital signs reviewed and stable  Post vital signs: Reviewed and stable  Last Vitals:  Vitals:   07/13/21 0953 07/13/21 1115  BP: (!) 146/78   Pulse: (!) 108   Resp: 16   Temp: 37.3 C 36.9 C  SpO2: 97%     Complications: No apparent anesthesia complications

## 2021-07-13 NOTE — Anesthesia Postprocedure Evaluation (Signed)
Anesthesia Post Note  Patient: Colvin Mazor  Procedure(s) Performed: CYSTOSCOPY WITH RETROGRADE PYELOGRAM/URETERAL STENT PLACEMENT (Left)  Patient location during evaluation: PACU Anesthesia Type: General Level of consciousness: awake Pain management: satisfactory to patient Vital Signs Assessment: post-procedure vital signs reviewed and stable Respiratory status: spontaneous breathing and respiratory function stable Cardiovascular status: blood pressure returned to baseline Anesthetic complications: no   No notable events documented.   Last Vitals:  Vitals:   07/13/21 1115 07/13/21 1122  BP:  113/64  Pulse:  (!) 107  Resp:  18  Temp: 36.9 C 36.9 C  SpO2:  99%    Last Pain:  Vitals:   07/13/21 1122  TempSrc:   PainSc: 0-No pain                 VAN STAVEREN,Jude Linck

## 2021-07-13 NOTE — Op Note (Signed)
Date of procedure: 07/13/21  Preoperative diagnosis:  Left pyelonephritis Left hydronephrosis  Postoperative diagnosis:  Same  Procedure: Cystoscopy, left retrograde pyelogram with intraoperative interpretation, left ureteral stent placement  Surgeon: Legrand Rams, MD  Anesthesia: General  Complications: None  Intraoperative findings:  Moderate size prostate, high bladder neck and obstructing lateral lobes, no median lobe Large 3 cm yellow stone in the bladder, no suspicious bladder lesions, ureteral orifices orthotopic bilaterally Frank purulence from left ureter, minimal hydronephrosis on retrograde  EBL: Minimal  Specimens: Left renal aspirate for culture  Drains: Left 6 French by 26 cm ureteral stent  Indication: Ryan Pugh is a 65 y.o. patient with poorly controlled diabetes with unique presentation of acute onset of severe left-sided flank pain with CT showing significant left perinephric stranding with minimal hydroureteronephrosis, no ureteral stones, large 3 cm bladder stone and a non-distended bladder.  UA in the ER was relatively benign, but he had leukocytosis with a left shift, elevated lactate, worsening AKI, and worsening left-sided flank pain and nausea.  After a long conversation about alternatives, risks, and benefits, he opted for cystoscopy and left ureteral stent placement to maximize drainage of the left kidney.  We also discussed the need to address the bladder stone and potentially outlet procedure at a future date once infection treated.  After reviewing the management options for treatment, they elected to proceed with the above surgical procedure(s). We have discussed the potential benefits and risks of the procedure, side effects of the proposed treatment, the likelihood of the patient achieving the goals of the procedure, and any potential problems that might occur during the procedure or recuperation. Informed consent has been obtained.  Description of  procedure:  The patient was taken to the operating room and general anesthesia was induced. SCDs were placed for DVT prophylaxis. The patient was placed in the dorsal lithotomy position, prepped and draped in the usual sterile fashion, and preoperative antibiotics(ceftriaxone) were administered. A preoperative time-out was performed.   A 21 French rigid cystoscope was used to intubate the urethra and a normal-appearing urethra was followed proximally into the bladder.  The prostate was moderate in size with a high bladder neck and obstructing lateral lobes, but no median lobe.  Thorough cystoscopy demonstrated a large 3 cm yellow bladder stone, but no other suspicious bladder mucosal lesions.  The ureteral orifices were orthotopic bilaterally.  There was frankly purulent drainage from the left ureter on cystoscopy.  With the aid of a ureteral access catheter, sensor wire was advanced into the left ureteral orifice and advanced easily up to the kidney under fluoroscopic vision without resistance.  The ureteral access catheter was advanced over the wire up into the kidney, and a hydronephrotic drip was noted.  A total of 8 mL cloudy maroon urine was aspirated and sent for culture.  A retrograde pyelogram was performed that showed minimal left-sided hydronephrosis, no extravasation or filling defects.  A 6 French by 26 cm ureteral stent was uneventfully placed with a curl in the upper pole, as well as under direct vision the bladder.  Urine was seen to drain through the side ports of the stent.  An 59 French Foley passed easily into the bladder with return of cloudy urine, and 10 mL were placed in the balloon, and the catheter connected to drainage.  Disposition: Stable to PACU  Plan: Follow-up in clinic in about 2 weeks to discuss left diagnostic ureteroscopy, cystolitholapaxy, possible UroLift/HoLEP Return to hospitalist service and continue broad-spectrum antibiotics and resuscitation  Legrand Rams, MD

## 2021-07-13 NOTE — Progress Notes (Signed)
PHARMACY - PHYSICIAN COMMUNICATION CRITICAL VALUE ALERT - BLOOD CULTURE IDENTIFICATION (BCID)  Ryan Pugh is an 65 y.o. male who presented to [facility] on 07/12/2021 with a chief complaint of abdominal pain.  Assessment:  Patient with mild L hydroureteronephrosis with perinephric stranding.  Cystoscopy today with frank purulence from L ureter.  12/15 blood cultures with GPC in anaerobic bottle of each set, BCID detects MSSE.    Name of physician (or Provider) Contacted: Dr Mayford Knife  Current antibiotics: Ceftriaxone  Changes to prescribed antibiotics recommended:  Patient is on recommended antibiotics - No changes needed Ceftriaxone will provide activity for MSSE but plan to narrow once have more culture date from urine and blood cultures.   Results for orders placed or performed during the hospital encounter of 07/12/21  Blood Culture ID Panel (Reflexed) (Collected: 07/12/2021  3:53 PM)  Result Value Ref Range   Enterococcus faecalis NOT DETECTED NOT DETECTED   Enterococcus Faecium NOT DETECTED NOT DETECTED   Listeria monocytogenes NOT DETECTED NOT DETECTED   Staphylococcus species DETECTED (A) NOT DETECTED   Staphylococcus aureus (BCID) NOT DETECTED NOT DETECTED   Staphylococcus epidermidis DETECTED (A) NOT DETECTED   Staphylococcus lugdunensis NOT DETECTED NOT DETECTED   Streptococcus species NOT DETECTED NOT DETECTED   Streptococcus agalactiae NOT DETECTED NOT DETECTED   Streptococcus pneumoniae NOT DETECTED NOT DETECTED   Streptococcus pyogenes NOT DETECTED NOT DETECTED   A.calcoaceticus-baumannii NOT DETECTED NOT DETECTED   Bacteroides fragilis NOT DETECTED NOT DETECTED   Enterobacterales NOT DETECTED NOT DETECTED   Enterobacter cloacae complex NOT DETECTED NOT DETECTED   Escherichia coli NOT DETECTED NOT DETECTED   Klebsiella aerogenes NOT DETECTED NOT DETECTED   Klebsiella oxytoca NOT DETECTED NOT DETECTED   Klebsiella pneumoniae NOT DETECTED NOT DETECTED   Proteus  species NOT DETECTED NOT DETECTED   Salmonella species NOT DETECTED NOT DETECTED   Serratia marcescens NOT DETECTED NOT DETECTED   Haemophilus influenzae NOT DETECTED NOT DETECTED   Neisseria meningitidis NOT DETECTED NOT DETECTED   Pseudomonas aeruginosa NOT DETECTED NOT DETECTED   Stenotrophomonas maltophilia NOT DETECTED NOT DETECTED   Candida albicans NOT DETECTED NOT DETECTED   Candida auris NOT DETECTED NOT DETECTED   Candida glabrata NOT DETECTED NOT DETECTED   Candida krusei NOT DETECTED NOT DETECTED   Candida parapsilosis NOT DETECTED NOT DETECTED   Candida tropicalis NOT DETECTED NOT DETECTED   Cryptococcus neoformans/gattii NOT DETECTED NOT DETECTED   Methicillin resistance mecA/C NOT DETECTED NOT DETECTED    Juliette Alcide, PharmD, BCPS, BCIDP Work Cell: [phone] 07/13/2021 12:47 PM

## 2021-07-13 NOTE — Progress Notes (Signed)
Inpatient Diabetes Program Recommendations  AACE/ADA: New Consensus Statement on Inpatient Glycemic Control (2015)  Target Ranges:  Prepandial:   less than 140 mg/dL      Peak postprandial:   less than 180 mg/dL (1-2 hours)      Critically ill patients:  140 - 180 mg/dL   Lab Results  Component Value Date   GLUCAP 269 (H) 07/13/2021    Review of Glycemic Control  Latest Reference Range & Units 07/12/21 20:08 07/12/21 21:20 07/13/21 07:47  Glucose-Capillary 70 - 99 mg/dL 594 (H) 585 (H) 929 (H)   Diabetes history: DM 2 Outpatient Diabetes medications:  Amaryl 4 mg bid, Lantus 14-17 units daily, Januvia 100 mg daily, Metformin 1000 mg bid Current orders for Inpatient glycemic control:  Novolog sensitive tid with meals and HS Semglee 12 units daily  Inpatient Diabetes Program Recommendations:    Note patient for procedure today.  Please consider slight increase of Semglee to 15 units daily.   Thanks,  Beryl Meager, RN, BC-ADM Inpatient Diabetes Coordinator Pager [phone]  (8a-5p)

## 2021-07-13 NOTE — Anesthesia Procedure Notes (Signed)
Procedure Name: LMA Insertion Date/Time: 07/13/2021 10:53 AM Performed by: Stormy Fabian, CRNA Pre-anesthesia Checklist: Patient identified, Patient being monitored, Timeout performed, Emergency Drugs available and Suction available Patient Re-evaluated:Patient Re-evaluated prior to induction Oxygen Delivery Method: Circle system utilized Preoxygenation: Pre-oxygenation with 100% oxygen Induction Type: IV induction Ventilation: Mask ventilation without difficulty LMA: LMA inserted Tube type: Oral Tube size: 4.0 mm Number of attempts: 1 Placement Confirmation: positive ETCO2 and breath sounds checked- equal and bilateral Tube secured with: Tape Dental Injury: Teeth and Oropharynx as per pre-operative assessment

## 2021-07-13 NOTE — Plan of Care (Signed)
Pt reports severe pain on his abdomen (left side); Fentanyl IV given with relief. Pt was nauseas and dry-retching; zofran given.

## 2021-07-13 NOTE — Progress Notes (Addendum)
Urology recommendations:  Homero Fellers purulence from left kidney intraoperatively, suspect left-sided pyelonephritis.  No evidence of ureteral stone, stent placed to maximize drainage of the left kidney and improve renal function.  Urine culture sent from left kidney.  Recommend continuing broad-spectrum antibiotics and narrowing as able for a 10 to 14-day course total.  Foley catheter can be removed when clinically improving and afebrile > 24 hours.  Will arrange urology follow-up in clinic in ~2 weeks to discuss long-term plan-> anticipate return to the OR in 3 to 4 weeks after infection treated for left diagnostic ureteroscopy, cystolitholapaxy, and likely outlet procedure(UroLift versus HOLEP).  Recommend continuing Flomax 0.4 mg nightly while stent in, would avoid oxybutynin or other bladder spasm medications with his likely history of incomplete bladder emptying and retention.  When renal function improves, okay for NSAIDs from a urology perspective.  Above plan discussed with patient's wife post-op.  Legrand Rams, MD 07/13/2021

## 2021-07-14 ENCOUNTER — Encounter: Payer: Self-pay | Admitting: Urology

## 2021-07-14 LAB — CBC
HCT: 35.2 % — ABNORMAL LOW (ref 39.0–52.0)
Hemoglobin: 12.3 g/dL — ABNORMAL LOW (ref 13.0–17.0)
MCH: 30.3 pg (ref 26.0–34.0)
MCHC: 34.9 g/dL (ref 30.0–36.0)
MCV: 86.7 fL (ref 80.0–100.0)
Platelets: 201 10*3/uL (ref 150–400)
RBC: 4.06 MIL/uL — ABNORMAL LOW (ref 4.22–5.81)
RDW: 13.1 % (ref 11.5–15.5)
WBC: 8.2 10*3/uL (ref 4.0–10.5)
nRBC: 0 % (ref 0.0–0.2)

## 2021-07-14 LAB — BASIC METABOLIC PANEL
Anion gap: 6 (ref 5–15)
BUN: 28 mg/dL — ABNORMAL HIGH (ref 8–23)
CO2: 22 mmol/L (ref 22–32)
Calcium: 7.4 mg/dL — ABNORMAL LOW (ref 8.9–10.3)
Chloride: 112 mmol/L — ABNORMAL HIGH (ref 98–111)
Creatinine, Ser: 1.39 mg/dL — ABNORMAL HIGH (ref 0.61–1.24)
GFR, Estimated: 57 mL/min — ABNORMAL LOW (ref >60)
Glucose, Bld: 144 mg/dL — ABNORMAL HIGH (ref 70–99)
Potassium: 3.7 mmol/L (ref 3.5–5.1)
Sodium: 140 mmol/L (ref 135–145)

## 2021-07-14 LAB — URINE CULTURE: Culture: 90000 — AB

## 2021-07-14 LAB — GLUCOSE, CAPILLARY
Glucose-Capillary: 141 mg/dL — ABNORMAL HIGH (ref 70–99)
Glucose-Capillary: 171 mg/dL — ABNORMAL HIGH (ref 70–99)
Glucose-Capillary: 145 mg/dL — ABNORMAL HIGH (ref 70–99)
Glucose-Capillary: 169 mg/dL — ABNORMAL HIGH (ref 70–99)

## 2021-07-14 MED ORDER — FAMOTIDINE 20 MG PO TABS
20.0000 mg | ORAL_TABLET | Freq: Two times a day (BID) | ORAL | Status: DC
  Administered 2021-07-14 – 2021-07-15 (×3): 20 mg via ORAL
  Filled 2021-07-14 (×3): qty 1

## 2021-07-14 MED ORDER — ENOXAPARIN SODIUM 40 MG/0.4ML IJ SOSY
40.0000 mg | PREFILLED_SYRINGE | INTRAMUSCULAR | Status: DC
  Administered 2021-07-14: 40 mg via SUBCUTANEOUS
  Filled 2021-07-14: qty 0.4

## 2021-07-14 MED ORDER — METRONIDAZOLE 500 MG/100ML IV SOLN
500.0000 mg | Freq: Two times a day (BID) | INTRAVENOUS | Status: DC
  Administered 2021-07-14 – 2021-07-15 (×2): 500 mg via INTRAVENOUS
  Filled 2021-07-14 (×3): qty 100

## 2021-07-14 NOTE — Progress Notes (Addendum)
PROGRESS NOTE    Ryan Pugh  HAL:[id number] DOB: 1955/10/26 DOA: 07/12/2021 PCP: Su Grand, MD  Assessment & Plan:   Principal Problem:   Complicated UTI (urinary tract infection) Active Problems:   Type II diabetes mellitus with renal manifestations ([place])   Acute renal failure superimposed on stage 3a chronic kidney disease (HCC)   Hydroureteronephrosis_left   Severe sepsis (HCC)   HLD (hyperlipidemia)   Bladder calculus     Severe sepsis: met criteria w/ leukocytosis, tachycardia, tachypnea, elevated lactic acid & secondary to complicated UTI.  Continue on IVFs. Will change IV abxs to clindamycin   UTI: urine cx growing stap epidermidis. Will change abxs to IV clindamycin    Hydroureteronephrosis: likely secondary to calculus. Continue on flomax. S/p cystoscopy w/ left ureteral stent placement. Uro recs apprec   Bacteremia: likely secondary to UTI/stone. Blood cxs growing gram positive cocci, sens pending. Abxs changed to IV clindamycin. Repeat blood cxs ordered today    AKI on CKDIIIa: baseline Cr around 1.24. Likely secondary to above. Cr is trending down today   DM2: poorly controlled, HbA1c 8.8. Continue on glargine, SSI w/ accuchecks    HLD: continue on statin      DVT prophylaxis: lovenox  Code Status:  full  Family Communication:  Disposition Plan: likely d/c back home   Level of care: Med-Surg  Status is: Inpatient  Remains inpatient appropriate because: severity of illness, requiring IV abxs     Consultants:  Urology   Procedures: s/p cystoscopy w/ stent placement    Antimicrobials: clindamycin    Subjective: Pt c/o malaise   Objective: Vitals:   07/13/21 1500 07/13/21 1930 07/14/21 0628 07/14/21 0722  BP: 116/77 112/67 (!) 141/79 140/75  Pulse: 98 91 87 80  Resp: _0 Temp: 98.1 F (36.7 C) 98 F (36.7 C) 99.3 F (37.4 C) 98.3 F (36.8 C)  TempSrc: Oral Oral Oral Oral  SpO2: 97% 94% 96% 95%  Weight:      Height:         Intake/Output Summary (Last 24 hours) at 07/14/2021 0831 Last data filed at 07/14/2021 0647 Gross per 24 hour  Intake 3063.08 ml  Output 1500 ml  Net 1563.08 ml   Filed Weights   07/12/21 1123  Weight: 83.5 kg    Examination:  General exam: appears comfortable  Respiratory system: clear breath sounds b/l  Cardiovascular system: S1/S2+. No rubs or clicks  Gastrointestinal system: Abd is soft, NT, ND & normal bowel sounds  Central nervous system: alert and oriented. Moves all extremities  Psychiatry: Judgement and insight appears normal. Flat mood and affect      Data Reviewed: I have personally reviewed following labs and imaging studies  CBC: Recent Labs  Lab 07/12/21 1030 07/12/21 1553 07/13/21 0421 07/14/21 0537  WBC 13.2* 14.3* 12.6* 8.2  NEUTROABS  --  13.3*  --   --   HGB 15.4 14.1 13.8 12.3*  HCT 42.4 40.7 39.9 35.2*  MCV 85.5 87.2 85.8 86.7  PLT 280 281 264 735   Basic Metabolic Panel: Recent Labs  Lab 07/12/21 1030 07/12/21 1553 07/13/21 0421 07/14/21 0537  NA 132* 133* 137 140  K 4.3 4.5 4.3 3.7  CL 101 105 106 112*  CO2 18* 20* 23 22  GLUCOSE 425* 366* 288* 144*  BUN 24* 21 27* 28*  CREATININE 1.71* 1.76* 2.18* 1.39*  CALCIUM 9.3 8.1* 8.3* 7.4*   GFR: Estimated Creatinine Clearance: 54.4 mL/min (A) (by C-G  formula based on SCr of 1.39 mg/dL (H)). Liver Function Tests: Recent Labs  Lab 07/12/21 1030 07/12/21 1553  AST 26 20  ALT 26 24  ALKPHOS 107 88  BILITOT 1.5* 0.9  PROT 7.5 6.3*  ALBUMIN 4.6 3.6   Recent Labs  Lab 07/12/21 1030  LIPASE 41   No results for input(s): AMMONIA in the last 168 hours. Coagulation Profile: Recent Labs  Lab 07/12/21 1628  INR 1.1   Cardiac Enzymes: No results for input(s): CKTOTAL, CKMB, CKMBINDEX, TROPONINI in the last 168 hours. BNP (last 3 results) No results for input(s): PROBNP in the last 8760 hours. HbA1C: No results for input(s): HGBA1C in the last 72 hours. CBG: Recent Labs   Lab 07/13/21 0957 07/13/21 1128 07/13/21 1627 07/13/21 2117 07/14/21 0723  GLUCAP 209* 206* 235* 234* 141*   Lipid Profile: No results for input(s): CHOL, HDL, LDLCALC, TRIG, CHOLHDL, LDLDIRECT in the last 72 hours. Thyroid Function Tests: No results for input(s): TSH, T4TOTAL, FREET4, T3FREE, THYROIDAB in the last 72 hours. Anemia Panel: No results for input(s): VITAMINB12, FOLATE, FERRITIN, TIBC, IRON, RETICCTPCT in the last 72 hours. Sepsis Labs: Recent Labs  Lab 07/12/21 1553 07/12/21 1758  LATICACIDVEN 2.8* 3.0*    Recent Results (from the past 240 hour(s))  Urine Culture     Status: Abnormal (Preliminary result)   Collection Time: 07/12/21  1:50 PM   Specimen: Urine, Random  Result Value Ref Range Status   Specimen Description   Final    URINE, RANDOM Performed at [facility], [address] [place], [place] [address]    Special Requests   Final    NONE Performed at [facility], [address] [place], [place] [address]    Culture (A)  Final    90,000 COLONIES/mL GRAM POSITIVE COCCI IDENTIFICATION AND SUSCEPTIBILITIES TO FOLLOW Performed at [facility], [place] [address], [place], [place] [address]    Report Status PENDING  Incomplete  Resp Panel by RT-PCR (Flu A&B, Covid) Nasopharyngeal Swab     Status: None   Collection Time: 07/12/21  3:33 PM   Specimen: Nasopharyngeal Swab; Nasopharyngeal(NP) swabs in vial transport medium  Result Value Ref Range Status   SARS Coronavirus 2 by RT PCR NEGATIVE NEGATIVE Final    Comment: (NOTE) SARS-CoV-2 target nucleic acids are NOT DETECTED.  The SARS-CoV-2 RNA is generally detectable in upper respiratory specimens during the acute phase of infection. The lowest concentration of SARS-CoV-2 viral copies this assay can detect is 138 copies/mL. A negative result does not preclude SARS-Cov-2 infection and should not be used as the sole basis for treatment or other patient management decisions.  A negative result may occur with  improper specimen collection/handling, submission of specimen other than nasopharyngeal swab, presence of viral mutation(s) within the areas targeted by this assay, and inadequate number of viral copies(<138 copies/mL). A negative result must be combined with clinical observations, patient history, and epidemiological information. The expected result is Negative.  Fact Sheet for Patients:  EntrepreneurPulse.com.au  Fact Sheet for Healthcare Providers:  IncredibleEmployment.be  This test is no t yet approved or cleared by the [place] FDA and  has been authorized for detection and/or diagnosis of SARS-CoV-2 by FDA under an Emergency Use Authorization (EUA). This EUA will remain  in effect (meaning this test can be used) for the duration of the COVID-19 declaration under Section 564(b)(1) of the Act, 21 U.S.C.section 360bbb-3(b)(1), unless the authorization is terminated  or revoked sooner.  Influenza A by PCR NEGATIVE NEGATIVE Final   Influenza B by PCR NEGATIVE NEGATIVE Final    Comment: (NOTE) The Xpert Xpress SARS-CoV-2/FLU/RSV plus assay is intended as an aid in the diagnosis of influenza from Nasopharyngeal swab specimens and should not be used as a sole basis for treatment. Nasal washings and aspirates are unacceptable for Xpert Xpress SARS-CoV-2/FLU/RSV testing.  Fact Sheet for Patients: EntrepreneurPulse.com.au  Fact Sheet for Healthcare Providers: IncredibleEmployment.be  This test is not yet approved or cleared by the [place] FDA and has been authorized for detection and/or diagnosis of SARS-CoV-2 by FDA under an Emergency Use Authorization (EUA). This EUA will remain in effect (meaning this test can be used) for the duration of the COVID-19 declaration under Section 564(b)(1) of the Act, 21 U.S.C. section 360bbb-3(b)(1), unless the authorization  is terminated or revoked.  Performed at [facility], [address] [place], [place] [address]   Culture, blood (Routine X 2) w Reflex to ID Panel     Status: None (Preliminary result)   Collection Time: 07/12/21  3:53 PM   Specimen: BLOOD  Result Value Ref Range Status   Specimen Description   Final    BLOOD LEFT ANTECUBITAL Performed at [facility], [address], [place], [place] [address]    Special Requests   Final    BOTTLES DRAWN AEROBIC AND ANAEROBIC Blood Culture adequate volume Performed at [facility], [address] [place], [place] [address]    Culture  Setup Time   Final    ANAEROBIC BOTTLE ONLY GRAM POSITIVE COCCI CRITICAL RESULT CALLED TO, READ BACK BY AND VERIFIED WITH: ABBY ELLINGTON _0  07/13/21 MJU Performed at [facility], [address] [place], [place] [address]    Culture   Final    Lonell Grandchild POSITIVE COCCI CULTURE REINCUBATED FOR BETTER GROWTH Performed at [facility], [place] [address], [place], [place] [address]    Report Status PENDING  Incomplete  Culture, blood (Routine X 2) w Reflex to ID Panel     Status: None (Preliminary result)   Collection Time: 07/12/21  3:53 PM   Specimen: BLOOD  Result Value Ref Range Status   Specimen Description   Final    BLOOD BLOOD RIGHT HAND Performed at [facility], [address], [place], [place] [address]    Special Requests   Final    BOTTLES DRAWN AEROBIC AND ANAEROBIC Blood Culture results may not be optimal due to an excessive volume of blood received in culture bottles Performed at [facility], [address] [place], [place] [address]    Culture  Setup Time   Final    IN BOTH AEROBIC AND ANAEROBIC BOTTLES GRAM POSITIVE COCCI Organism ID to follow CRITICAL RESULT CALLED TO, READ BACK BY AND VERIFIED WITH: ABBY ELLINGTON _1  07/13/21 MJU Performed at [facility], [address], [place], [place] [address]    Culture    Final    GRAM POSITIVE COCCI CULTURE REINCUBATED FOR BETTER GROWTH Performed at [facility], [place] [address], [place],  [address]    Report Status PENDING  Incomplete  Blood Culture ID Panel (Reflexed)     Status: Abnormal   Collection Time: 07/12/21  3:53 PM  Result Value Ref Range Status   Enterococcus faecalis NOT DETECTED NOT DETECTED Final   Enterococcus Faecium NOT DETECTED NOT DETECTED Final   Listeria monocytogenes NOT DETECTED NOT DETECTED Final   Staphylococcus species DETECTED (A) NOT  DETECTED Final    Comment: CRITICAL RESULT CALLED TO, READ BACK BY AND VERIFIED WITH: ABBY ELLINGTON _0  07/13/21 MJU    Staphylococcus aureus (BCID) NOT DETECTED NOT DETECTED Final   Staphylococcus epidermidis DETECTED (A) NOT DETECTED Final    Comment: CRITICAL RESULT CALLED TO, READ BACK BY AND VERIFIED WITH: ABBY ELLINGTON _1  07/13/21 MJU    Staphylococcus lugdunensis NOT DETECTED NOT DETECTED Final   Streptococcus species NOT DETECTED NOT DETECTED Final   Streptococcus agalactiae NOT DETECTED NOT DETECTED Final   Streptococcus pneumoniae NOT DETECTED NOT DETECTED Final   Streptococcus pyogenes NOT DETECTED NOT DETECTED Final   A.calcoaceticus-baumannii NOT DETECTED NOT DETECTED Final   Bacteroides fragilis NOT DETECTED NOT DETECTED Final   Enterobacterales NOT DETECTED NOT DETECTED Final   Enterobacter cloacae complex NOT DETECTED NOT DETECTED Final   Escherichia coli NOT DETECTED NOT DETECTED Final   Klebsiella aerogenes NOT DETECTED NOT DETECTED Final   Klebsiella oxytoca NOT DETECTED NOT DETECTED Final   Klebsiella pneumoniae NOT DETECTED NOT DETECTED Final   Proteus species NOT DETECTED NOT DETECTED Final   Salmonella species NOT DETECTED NOT DETECTED Final   Serratia marcescens NOT DETECTED NOT DETECTED Final   Haemophilus influenzae NOT DETECTED NOT DETECTED Final   Neisseria meningitidis NOT DETECTED NOT DETECTED Final   Pseudomonas aeruginosa NOT  DETECTED NOT DETECTED Final   Stenotrophomonas maltophilia NOT DETECTED NOT DETECTED Final   Candida albicans NOT DETECTED NOT DETECTED Final   Candida auris NOT DETECTED NOT DETECTED Final   Candida glabrata NOT DETECTED NOT DETECTED Final   Candida krusei NOT DETECTED NOT DETECTED Final   Candida parapsilosis NOT DETECTED NOT DETECTED Final   Candida tropicalis NOT DETECTED NOT DETECTED Final   Cryptococcus neoformans/gattii NOT DETECTED NOT DETECTED Final   Methicillin resistance mecA/C NOT DETECTED NOT DETECTED Final    Comment: Performed at [facility], [address] [place], [place] [address]         Radiology Studies: CT ABDOMEN PELVIS W CONTRAST  Result Date: 07/12/2021 CLINICAL DATA:  Acute left lower quadrant abdominal pain. EXAM: CT ABDOMEN AND PELVIS WITH CONTRAST TECHNIQUE: Multidetector CT imaging of the abdomen and pelvis was performed using the standard protocol following bolus administration of intravenous contrast. CONTRAST:  70m OMNIPAQUE IOHEXOL 300 MG/ML  SOLN COMPARISON:  None. FINDINGS: Lower chest: No acute abnormality. Hepatobiliary: Probable hepatic steatosis. No gallstones, gallbladder wall thickening, or biliary dilatation. Pancreas: Unremarkable. No pancreatic ductal dilatation or surrounding inflammatory changes. Spleen: Normal in size without focal abnormality. Adrenals/Urinary Tract: Adrenal glands appear normal. Right kidney and ureter are unremarkable. Minimal left hydroureteronephrosis is noted with perinephric stranding which appears to be due to focal high density area is seen in the midportion of the left ureter. It is uncertain if this represents small stone or stones or possibly blood clot. Small nonobstructive calculus is noted in lower pole collecting system of left kidney. 3 cm bladder calculus is noted in dependent portion of urinary bladder. Small portion of the urinary bladder is seen extending into moderate size right inguinal hernia.  Stomach/Bowel: Stomach is within normal limits. Appendix appears normal. No evidence of bowel wall thickening, distention, or inflammatory changes. Vascular/Lymphatic: Aortic atherosclerosis. No enlarged abdominal or pelvic lymph nodes. Reproductive: Prostate is unremarkable. Other: No ascites is noted. Moderate size predominantly fat containing right inguinal hernia is noted which does contain a small portion of the urinary bladder as detailed above. Musculoskeletal: No acute or significant osseous findings. IMPRESSION: Minimal left hydroureteronephrosis is  noted with perinephric stranding which appears to be due to focal high density area seen in midportion of left ureter. It is uncertain if this represents small stone or stones or possibly blood clot. Small nonobstructive calculus is noted in lower pole collecting system of left kidney. 3 cm bladder calculus is noted in dependent portion of urinary bladder. Moderate sized right inguinal hernia is noted which contains a small portion of the urinary bladder. Probable hepatic steatosis. Aortic Atherosclerosis (ICD10-I70.0). Electronically Signed   By: Marijo Conception M.D.   On: 07/12/2021 13:04   DG OR UROLOGY CYSTO IMAGE ([place])  Result Date: 07/13/2021 There is no interpretation for this exam.  This order is for images obtained during a surgical procedure.  Please See "Surgeries" Tab for more information regarding the procedure.        Scheduled Meds:  aspirin EC  81 mg Oral Daily   Chlorhexidine Gluconate Cloth  6 each Topical Daily   insulin aspart  0-5 Units Subcutaneous QHS   insulin aspart  0-9 Units Subcutaneous TID WC   insulin glargine-yfgn  12 Units Subcutaneous Daily   loratadine  10 mg Oral Daily   simvastatin  40 mg Oral QHS   tamsulosin  0.4 mg Oral Daily   Continuous Infusions:  sodium chloride 100 mL/hr at 07/14/21 0642   cefTRIAXone (ROCEPHIN)  IV Stopped (07/13/21 2244)     LOS: 2 days    Time spent: 30 mins      Wyvonnia Dusky, MD [facility] Pager [phone]  If 7PM-7AM, please contact night-coverage 07/14/2021, 8:31 AM

## 2021-07-14 NOTE — TOC CM/SW Note (Signed)
°  Transition of Care (TOC) Screening Note   Patient Details  Name: Trafton Roker Date of Birth: March 31, 1956   Transition of Care [facility]) CM/SW Contact:    Margarito Liner, LCSW Phone Number: 07/14/2021, 3:10 PM    Transition of Care Department [facility]) has reviewed patient and no TOC needs have been identified at this time. We will continue to monitor patient advancement through interdisciplinary progression rounds. If new patient transition needs arise, please place a TOC consult.

## 2021-07-15 LAB — BASIC METABOLIC PANEL
Anion gap: 6 (ref 5–15)
BUN: 21 mg/dL (ref 8–23)
CO2: 22 mmol/L (ref 22–32)
Calcium: 7.9 mg/dL — ABNORMAL LOW (ref 8.9–10.3)
Chloride: 112 mmol/L — ABNORMAL HIGH (ref 98–111)
Creatinine, Ser: 1.15 mg/dL (ref 0.61–1.24)
GFR, Estimated: >60 mL/min (ref >60)
Glucose, Bld: 114 mg/dL — ABNORMAL HIGH (ref 70–99)
Potassium: 3.7 mmol/L (ref 3.5–5.1)
Sodium: 140 mmol/L (ref 135–145)

## 2021-07-15 LAB — CBC
HCT: 35.7 % — ABNORMAL LOW (ref 39.0–52.0)
Hemoglobin: 12.2 g/dL — ABNORMAL LOW (ref 13.0–17.0)
MCH: 30.1 pg (ref 26.0–34.0)
MCHC: 34.2 g/dL (ref 30.0–36.0)
MCV: 88.1 fL (ref 80.0–100.0)
Platelets: 217 10*3/uL (ref 150–400)
RBC: 4.05 MIL/uL — ABNORMAL LOW (ref 4.22–5.81)
RDW: 13 % (ref 11.5–15.5)
WBC: 7.1 10*3/uL (ref 4.0–10.5)
nRBC: 0 % (ref 0.0–0.2)

## 2021-07-15 LAB — GLUCOSE, CAPILLARY
Glucose-Capillary: 108 mg/dL — ABNORMAL HIGH (ref 70–99)
Glucose-Capillary: 163 mg/dL — ABNORMAL HIGH (ref 70–99)

## 2021-07-15 MED ORDER — CIPROFLOXACIN HCL 500 MG PO TABS
500.0000 mg | ORAL_TABLET | Freq: Two times a day (BID) | ORAL | Status: DC
  Administered 2021-07-15: 16:00:00 500 mg via ORAL
  Filled 2021-07-15: qty 1

## 2021-07-15 MED ORDER — CIPROFLOXACIN HCL 500 MG PO TABS: 500.0000 mg | ORAL_TABLET | Freq: Two times a day (BID) | ORAL | 0 refills | Status: AC

## 2021-07-15 NOTE — Discharge Summary (Signed)
Physician Discharge Summary  Ryan Pugh EHU:[id number] DOB: 16-Jan-1956 DOA: 07/12/2021  PCP: Su Grand, MD  Admit date: 07/12/2021 Discharge date: 07/15/2021  Admitted From: home  Disposition:  home   Recommendations for Outpatient Follow-up:  Follow up with PCP in 1-2 weeks F/u w/ uro, Dr. Diamantina Providence, in 2 weeks   Home Health: no  Equipment/Devices:  Discharge Condition: stable  CODE STATUS: full  Diet recommendation:  Carb Modified  Brief/Interim Summary: HPI was taken from Dr. Blaine Pugh: Ryan Pugh is a 65 y.o. male with medical history significant of kidney stone, CKD-3A, hyperlipidemia, diabetes mellitus, who presents with abdominal pain.   Pt states that he has sudden onset abdominal pain which started in early morning.  Associate with nausea and vomiting.  No diarrhea.  No fever or chills. His abdominal pain is located in left lower quadrant, constant, sharp, severe, nonradiating.  Patient has had increased urinary frequency, no dysuria or burning on urination.  No hematuria.  Patient denies chest pain, cough, shortness of breath.   ED Course: pt was found to have WBC 13.2, pending COVID PCR, urinalysis (clear appearance, negative leukocyte, positive nitrate, rare bacteria, WBC 0-5), worsening renal function, temperature normal, blood pressure 148/89, heart rate 114, RR 22, oxygen saturation 94-97% on room air. CT scan showed left hydroureteronephrosis and a large bladder calculus. Patient is admitted to [facility] as inpatient.  Dr. Diamantina Providence of urology is consulted.   CT-abd/pelvis Minimal left hydroureteronephrosis is noted with perinephric stranding which appears to be due to focal high density area seen in midportion of left ureter. It is uncertain if this represents small stone or stones or possibly blood clot. Small nonobstructive calculus is noted in lower pole collecting system of left kidney.   3 cm bladder calculus is noted in dependent portion of urinary bladder.    Moderate sized right inguinal hernia is noted which contains a small portion of the urinary bladder.   Probable hepatic steatosis.   Hospital course from Dr. Jimmye Norman 12/16-12/28/22: Pt presented w/ severe sepsis secondary to complicated UTI, bacteremia & calculus. Pt was initially put on IV rocephin. Pt is s/p cystoscopy w/ left ureteral stent placement. Urine cx and blood cxs both grew staph epidermidis. Repeat blood cxs NGTD. Foley was removed prior to d/c and pt was able to urinate after foley was removed. Pt will f/u w/ urology (Dr. Diamantina Providence) in 2 weeks. For more information, please see previous progress/consult notes.   Discharge Diagnoses:  Principal Problem:   Complicated UTI (urinary tract infection) Active Problems:   Type II diabetes mellitus with renal manifestations (HCC)   Acute renal failure superimposed on stage 3a chronic kidney disease (HCC)   Hydroureteronephrosis_left   Severe sepsis (HCC)   HLD (hyperlipidemia)   Bladder calculus    Severe sepsis: met criteria w/ leukocytosis, tachycardia, tachypnea, elevated lactic acid & secondary to complicated UTI.  D/c IVFs.. Abxs changed to po cipro as per cx results. Severe sepsis resolved   UTI: urine cx growing stap epidermidis. Changed abxs to po cipro    Hydroureteronephrosis: likely secondary to calculus. Continue on flomax. S/p cystoscopy w/ left ureteral stent placement. Uro recs apprec   Bacteremia: likely secondary to UTI/stone. Blood cxs growing gram positive cocci, sens pending. Repeat blood cxs NGTD. Continue on po cipro    AKI on CKDIIIa: baseline Cr around 1.24. Likely secondary to above. Cr is better than baseline   DM2: poorly controlled, HbA1c 8.8. Continue on glargine, SSI w/ accuchecks  HLD: continue on statin     Discharge Instructions  Discharge Instructions     Diet - low sodium heart healthy   Complete by: As directed    Diet Carb Modified   Complete by: As directed    Discharge  instructions   Complete by: As directed    F/u w/ PCP in 1-2 weeks. F/u w/ uro, Dr. Diamantina Providence, in 2 weeks   Increase activity slowly   Complete by: As directed    No wound care   Complete by: As directed       Allergies as of 07/15/2021   No Known Allergies      Medication List     TAKE these medications    Aspirin 81 MG Caps Take 1 tablet by mouth daily.   cetirizine 10 MG tablet Commonly known as: ZYRTEC Take 1 tablet by mouth daily.   ciprofloxacin 500 MG tablet Commonly known as: Cipro Take 1 tablet (500 mg total) by mouth 2 (two) times daily for 10 days.   glimepiride 4 MG tablet Commonly known as: AMARYL Take 1 tablet by mouth 2 (two) times daily.   Januvia 100 MG tablet Generic drug: sitaGLIPtin Take 100 mg by mouth daily.   Lantus SoloStar 100 UNIT/ML Solostar Pen Generic drug: insulin glargine Inject 14-17 Units into the skin daily.   metFORMIN 500 MG tablet Commonly known as: GLUCOPHAGE Take 1,000 mg by mouth 2 (two) times daily.   ondansetron 4 MG disintegrating tablet Commonly known as: ZOFRAN-ODT Take 1 tablet (4 mg total) by mouth every 8 (eight) hours as needed.   oxyCODONE-acetaminophen 7.5-325 MG tablet Commonly known as: Percocet Take 1 tablet by mouth every 4 (four) hours as needed for severe pain.   simvastatin 40 MG tablet Commonly known as: ZOCOR Take 40 mg by mouth at bedtime.   tadalafil 5 MG tablet Commonly known as: CIALIS Take 1 tablet by mouth as directed.   tamsulosin 0.4 MG Caps capsule Commonly known as: FLOMAX Take 1 capsule (0.4 mg total) by mouth daily.   venlafaxine XR 75 MG 24 hr capsule Commonly known as: EFFEXOR-XR Take 1 capsule by mouth daily.        Follow-up Information     Schedule an appointment as soon as possible for a visit  with Billey Co, MD.   Specialty: Urology Contact information: [place] [address] [phone]         [place].   Specialty: Emergency Medicine Why: If symptoms worsen Contact information: [place] [phone] [place] [phone]               No Known Allergies  Consultations: Urology    Procedures/Studies: CT ABDOMEN PELVIS W CONTRAST  Result Date: 07/12/2021 CLINICAL DATA:  Acute left lower quadrant abdominal pain. EXAM: CT ABDOMEN AND PELVIS WITH CONTRAST TECHNIQUE: Multidetector CT imaging of the abdomen and pelvis was performed using the standard protocol following bolus administration of intravenous contrast. CONTRAST:  21m OMNIPAQUE IOHEXOL 300 MG/ML  SOLN COMPARISON:  None. FINDINGS: Lower chest: No acute abnormality. Hepatobiliary: Probable hepatic steatosis. No gallstones, gallbladder wall thickening, or biliary dilatation. Pancreas: Unremarkable. No pancreatic ductal dilatation or surrounding inflammatory changes. Spleen: Normal in size without focal abnormality. Adrenals/Urinary Tract: Adrenal glands appear normal. Right kidney and ureter are unremarkable. Minimal left hydroureteronephrosis is noted with perinephric stranding which appears to be due to focal high density area is seen in the  midportion of the left ureter. It is uncertain if this represents small stone or stones or possibly blood clot. Small nonobstructive calculus is noted in lower pole collecting system of left kidney. 3 cm bladder calculus is noted in dependent portion of urinary bladder. Small portion of the urinary bladder is seen extending into moderate size right inguinal hernia. Stomach/Bowel: Stomach is within normal limits. Appendix appears normal. No evidence of bowel wall thickening, distention, or inflammatory changes. Vascular/Lymphatic: Aortic atherosclerosis. No enlarged abdominal or pelvic lymph nodes. Reproductive: Prostate is unremarkable. Other: No ascites is noted. Moderate size predominantly fat containing right inguinal hernia is noted  which does contain a small portion of the urinary bladder as detailed above. Musculoskeletal: No acute or significant osseous findings. IMPRESSION: Minimal left hydroureteronephrosis is noted with perinephric stranding which appears to be due to focal high density area seen in midportion of left ureter. It is uncertain if this represents small stone or stones or possibly blood clot. Small nonobstructive calculus is noted in lower pole collecting system of left kidney. 3 cm bladder calculus is noted in dependent portion of urinary bladder. Moderate sized right inguinal hernia is noted which contains a small portion of the urinary bladder. Probable hepatic steatosis. Aortic Atherosclerosis (ICD10-I70.0). Electronically Signed   By: Marijo Conception M.D.   On: 07/12/2021 13:04   DG OR UROLOGY CYSTO IMAGE ([facility])  Result Date: 07/13/2021 There is no interpretation for this exam.  This order is for images obtained during a surgical procedure.  Please See "Surgeries" Tab for more information regarding the procedure.   (Echo, Carotid, EGD, Colonoscopy, ERCP)    Subjective: Pt denies any complicates    Discharge Exam: Vitals:   07/15/21 0506 07/15/21 0731  BP: (!) 151/77 (!) 147/77  Pulse: 74 74  Resp: 16 20  Temp: 98.2 F (36.8 C) 98.1 F (36.7 C)  SpO2: 96% 97%   Vitals:   07/14/21 1503 07/14/21 1940 07/15/21 0506 07/15/21 0731  BP: 138/82 136/68 (!) 151/77 (!) 147/77  Pulse: 84 74 74 74  Resp: 16 16 16 20   Temp: 98.3 F (36.8 C) 97.8 F (36.6 C) 98.2 F (36.8 C) 98.1 F (36.7 C)  TempSrc: Oral   Oral  SpO2: 96% 96% 96% 97%  Weight:      Height:        General: Pt is alert, awake, not in acute distress Cardiovascular: S1/S2 +, no rubs, no gallops Respiratory: CTA bilaterally, no wheezing, no rhonchi Abdominal: Soft, NT, ND, bowel sounds + Extremities: no edema, no cyanosis    The results of significant diagnostics from this hospitalization (including imaging, microbiology,  ancillary and laboratory) are listed below for reference.     Microbiology: Recent Results (from the past 240 hour(s))  Urine Culture     Status: Abnormal   Collection Time: 07/12/21  1:50 PM   Specimen: Urine, Random  Result Value Ref Range Status   Specimen Description   Final    URINE, RANDOM Performed at [facility], [address] [place], [place] [address]    Special Requests   Final    NONE Performed at [facility], [place], [place] [address]    Culture 90,000 COLONIES/mL STAPHYLOCOCCUS EPIDERMIDIS (A)  Final   Report Status 07/14/2021 FINAL  Final   Organism ID, Bacteria STAPHYLOCOCCUS EPIDERMIDIS (A)  Final      Susceptibility   Staphylococcus epidermidis - MIC*    CIPROFLOXACIN <=0.5 SENSITIVE Sensitive  GENTAMICIN <=0.5 SENSITIVE Sensitive     NITROFURANTOIN <=16 SENSITIVE Sensitive     OXACILLIN <=0.25 SENSITIVE Sensitive     TETRACYCLINE >=16 RESISTANT Resistant     VANCOMYCIN 1 SENSITIVE Sensitive     TRIMETH/SULFA <=10 SENSITIVE Sensitive     CLINDAMYCIN <=0.25 SENSITIVE Sensitive     RIFAMPIN <=0.5 SENSITIVE Sensitive     Inducible Clindamycin NEGATIVE Sensitive     * 90,000 COLONIES/mL STAPHYLOCOCCUS EPIDERMIDIS  Resp Panel by RT-PCR (Flu A&B, Covid) Nasopharyngeal Swab     Status: None   Collection Time: 07/12/21  3:33 PM   Specimen: Nasopharyngeal Swab; Nasopharyngeal(NP) swabs in vial transport medium  Result Value Ref Range Status   SARS Coronavirus 2 by RT PCR NEGATIVE NEGATIVE Final    Comment: (NOTE) SARS-CoV-2 target nucleic acids are NOT DETECTED.  The SARS-CoV-2 RNA is generally detectable in upper respiratory specimens during the acute phase of infection. The lowest concentration of SARS-CoV-2 viral copies this assay can detect is 138 copies/mL. A negative result does not preclude SARS-Cov-2 infection and should not be used as the sole basis for treatment or other patient management decisions. A  negative result may occur with  improper specimen collection/handling, submission of specimen other than nasopharyngeal swab, presence of viral mutation(s) within the areas targeted by this assay, and inadequate number of viral copies(<138 copies/mL). A negative result must be combined with clinical observations, patient history, and epidemiological information. The expected result is Negative.  Fact Sheet for Patients:  EntrepreneurPulse.com.au  Fact Sheet for Healthcare Providers:  IncredibleEmployment.be  This test is no t yet approved or cleared by the [place] FDA and  has been authorized for detection and/or diagnosis of SARS-CoV-2 by FDA under an Emergency Use Authorization (EUA). This EUA will remain  in effect (meaning this test can be used) for the duration of the COVID-19 declaration under Section 564(b)(1) of the Act, 21 U.S.C.section 360bbb-3(b)(1), unless the authorization is terminated  or revoked sooner.       Influenza A by PCR NEGATIVE NEGATIVE Final   Influenza B by PCR NEGATIVE NEGATIVE Final    Comment: (NOTE) The Xpert Xpress SARS-CoV-2/FLU/RSV plus assay is intended as an aid in the diagnosis of influenza from Nasopharyngeal swab specimens and should not be used as a sole basis for treatment. Nasal washings and aspirates are unacceptable for Xpert Xpress SARS-CoV-2/FLU/RSV testing.  Fact Sheet for Patients: EntrepreneurPulse.com.au  Fact Sheet for Healthcare Providers: IncredibleEmployment.be  This test is not yet approved or cleared by the [place] FDA and has been authorized for detection and/or diagnosis of SARS-CoV-2 by FDA under an Emergency Use Authorization (EUA). This EUA will remain in effect (meaning this test can be used) for the duration of the COVID-19 declaration under Section 564(b)(1) of the Act, 21 U.S.C. section 360bbb-3(b)(1), unless the authorization  is terminated or revoked.  Performed at [facility], [address] [place], [place] [address]   Culture, blood (Routine X 2) w Reflex to ID Panel     Status: Abnormal (Preliminary result)   Collection Time: 07/12/21  3:53 PM   Specimen: BLOOD  Result Value Ref Range Status   Specimen Description   Final    BLOOD LEFT ANTECUBITAL Performed at [facility], [address] [place], [place] [address]    Special Requests   Final    BOTTLES DRAWN AEROBIC AND ANAEROBIC Blood Culture adequate volume Performed at [facility], [address] [place], [place] [address]  Culture  Setup Time   Final    ANAEROBIC BOTTLE ONLY GRAM POSITIVE COCCI CRITICAL RESULT CALLED TO, READ BACK BY AND VERIFIED WITH: ABBY ELLINGTON @1115  07/13/21 MJU Performed at [facility], [address] [place], [place] [address]    Culture STAPHYLOCOCCUS EPIDERMIDIS (A)  Final   Report Status PENDING  Incomplete  Culture, blood (Routine X 2) w Reflex to ID Panel     Status: Abnormal (Preliminary result)   Collection Time: 07/12/21  3:53 PM   Specimen: BLOOD  Result Value Ref Range Status   Specimen Description   Final    BLOOD BLOOD RIGHT HAND Performed at [facility], [address], [place], [place] [address]    Special Requests   Final    BOTTLES DRAWN AEROBIC AND ANAEROBIC Blood Culture results may not be optimal due to an excessive volume of blood received in culture bottles Performed at [facility], [address] [place], [place] [address]    Culture  Setup Time   Final    IN BOTH AEROBIC AND ANAEROBIC BOTTLES GRAM POSITIVE COCCI Organism ID to follow CRITICAL RESULT CALLED TO, READ BACK BY AND VERIFIED WITH: ABBY ELLINGTON @1115  07/13/21 MJU Performed at [facility], [address], [place], [place] [address]    Culture (A)  Final    STAPHYLOCOCCUS EPIDERMIDIS SUSCEPTIBILITIES TO FOLLOW Performed at [facility], [place] [address] [place], [place] [address]    Report Status PENDING  Incomplete  Blood Culture ID Panel (Reflexed)     Status: Abnormal   Collection Time: 07/12/21  3:53 PM  Result Value Ref Range Status   Enterococcus faecalis NOT DETECTED NOT DETECTED Final   Enterococcus Faecium NOT DETECTED NOT DETECTED Final   Listeria monocytogenes NOT DETECTED NOT DETECTED Final   Staphylococcus species DETECTED (A) NOT DETECTED Final    Comment: CRITICAL RESULT CALLED TO, READ BACK BY AND VERIFIED WITH: ABBY ELLINGTON @1115  07/13/21 MJU    Staphylococcus aureus (BCID) NOT DETECTED NOT DETECTED Final   Staphylococcus epidermidis DETECTED (A) NOT DETECTED Final    Comment: CRITICAL RESULT CALLED TO, READ BACK BY AND VERIFIED WITH: ABBY ELLINGTON @1115  07/13/21 MJU    Staphylococcus lugdunensis NOT DETECTED NOT DETECTED Final   Streptococcus species NOT DETECTED NOT DETECTED Final   Streptococcus agalactiae NOT DETECTED NOT DETECTED Final   Streptococcus pneumoniae NOT DETECTED NOT DETECTED Final   Streptococcus pyogenes NOT DETECTED NOT DETECTED Final   A.calcoaceticus-baumannii NOT DETECTED NOT DETECTED Final   Bacteroides fragilis NOT DETECTED NOT DETECTED Final   Enterobacterales NOT DETECTED NOT DETECTED Final   Enterobacter cloacae complex NOT DETECTED NOT DETECTED Final   Escherichia coli NOT DETECTED NOT DETECTED Final   Klebsiella aerogenes NOT DETECTED NOT DETECTED Final   Klebsiella oxytoca NOT DETECTED NOT DETECTED Final   Klebsiella pneumoniae NOT DETECTED NOT DETECTED Final   Proteus species NOT DETECTED NOT DETECTED Final   Salmonella species NOT DETECTED NOT DETECTED Final   Serratia marcescens NOT DETECTED NOT DETECTED Final   Haemophilus influenzae NOT DETECTED NOT DETECTED Final   Neisseria meningitidis NOT DETECTED NOT DETECTED Final   Pseudomonas aeruginosa NOT DETECTED NOT DETECTED Final   Stenotrophomonas maltophilia NOT DETECTED NOT DETECTED Final   Candida albicans  NOT DETECTED NOT DETECTED Final   Candida auris NOT DETECTED NOT DETECTED Final   Candida glabrata NOT DETECTED NOT DETECTED Final   Candida krusei NOT DETECTED NOT DETECTED Final   Candida parapsilosis NOT  DETECTED NOT DETECTED Final   Candida tropicalis NOT DETECTED NOT DETECTED Final   Cryptococcus neoformans/gattii NOT DETECTED NOT DETECTED Final   Methicillin resistance mecA/C NOT DETECTED NOT DETECTED Final    Comment: Performed at [facility], [address] [place], [place] [address]  Urine Culture     Status: Abnormal (Preliminary result)   Collection Time: 07/13/21 11:07 AM   Specimen: PATH Other; Urine  Result Value Ref Range Status   Specimen Description   Final    CYSTOSCOPY Performed at [facility], [address] [place], [place] [address]    Special Requests   Final    urine culure via cystoscopy Performed at [facility], [address] [place], [place] [address]    Culture (A)  Final    >=100,000 COLONIES/mL STAPHYLOCOCCUS EPIDERMIDIS SUSCEPTIBILITIES TO FOLLOW Performed at [facility], [place] [address], [place], [place] [address]    Report Status PENDING  Incomplete  CULTURE, BLOOD (ROUTINE X 2) w Reflex to ID Panel     Status: None (Preliminary result)   Collection Time: 07/14/21 11:01 AM   Specimen: BLOOD  Result Value Ref Range Status   Specimen Description BLOOD RIGHT HAND  Final   Special Requests   Final    BOTTLES DRAWN AEROBIC AND ANAEROBIC Blood Culture adequate volume   Culture   Final    NO GROWTH < 24 HOURS Performed at [facility], [address], [place], [place] [address]    Report Status PENDING  Incomplete  CULTURE, BLOOD (ROUTINE X 2) w Reflex to ID Panel     Status: None (Preliminary result)   Collection Time: 07/14/21 11:01 AM   Specimen: BLOOD  Result Value Ref Range Status   Specimen Description BLOOD RIGHT AC  Final   Special Requests   Final    BOTTLES DRAWN AEROBIC AND ANAEROBIC  Blood Culture adequate volume   Culture   Final    NO GROWTH < 24 HOURS Performed at [facility], [address] [place], [place] [address]    Report Status PENDING  Incomplete     Labs: BNP (last 3 results) No results for input(s): BNP in the last 8760 hours. Basic Metabolic Panel: Recent Labs  Lab 07/12/21 1030 07/12/21 1553 07/13/21 0421 07/14/21 0537 07/15/21 0445  NA 132* 133* 137 140 140  K 4.3 4.5 4.3 3.7 3.7  CL 101 105 106 112* 112*  CO2 18* 20* 23 22 22   GLUCOSE 425* 366* 288* 144* 114*  BUN 24* 21 27* 28* 21  CREATININE 1.71* 1.76* 2.18* 1.39* 1.15  CALCIUM 9.3 8.1* 8.3* 7.4* 7.9*   Liver Function Tests: Recent Labs  Lab 07/12/21 1030 07/12/21 1553  AST 26 20  ALT 26 24  ALKPHOS 107 88  BILITOT 1.5* 0.9  PROT 7.5 6.3*  ALBUMIN 4.6 3.6   Recent Labs  Lab 07/12/21 1030  LIPASE 41   No results for input(s): AMMONIA in the last 168 hours. CBC: Recent Labs  Lab 07/12/21 1030 07/12/21 1553 07/13/21 0421 07/14/21 0537 07/15/21 0445  WBC 13.2* 14.3* 12.6* 8.2 7.1  NEUTROABS  --  13.3*  --   --   --   HGB 15.4 14.1 13.8 12.3* 12.2*  HCT 42.4 40.7 39.9 35.2* 35.7*  MCV 85.5 87.2 85.8 86.7 88.1  PLT 280 281 264 201 217   Cardiac Enzymes: No results for input(s): CKTOTAL, CKMB, CKMBINDEX, TROPONINI in the last 168 hours. BNP: Invalid input(s):  POCBNP CBG: Recent Labs  Lab 07/14/21 1242 07/14/21 1807 07/14/21 2016 07/15/21 0729 07/15/21 1131  GLUCAP 171* 145* 169* 108* 163*   D-Dimer No results for input(s): DDIMER in the last 72 hours. Hgb A1c No results for input(s): HGBA1C in the last 72 hours. Lipid Profile No results for input(s): CHOL, HDL, LDLCALC, TRIG, CHOLHDL, LDLDIRECT in the last 72 hours. Thyroid function studies No results for input(s): TSH, T4TOTAL, T3FREE, THYROIDAB in the last 72 hours.  Invalid input(s): FREET3 Anemia work up No results for input(s): VITAMINB12, FOLATE, FERRITIN, TIBC, IRON, RETICCTPCT  in the last 72 hours. Urinalysis    Component Value Date/Time   COLORURINE STRAW (A) 07/12/2021 1350   APPEARANCEUR CLEAR 07/12/2021 1350   LABSPEC 1.010 07/12/2021 1350   PHURINE 6.5 07/12/2021 1350   GLUCOSEU >1,000 (A) 07/12/2021 1350   HGBUR SMALL (A) 07/12/2021 1350   BILIRUBINUR NEGATIVE 07/12/2021 1350   KETONESUR 80 (A) 07/12/2021 1350   PROTEINUR NEGATIVE 07/12/2021 1350   NITRITE POSITIVE (A) 07/12/2021 1350   LEUKOCYTESUR NEGATIVE 07/12/2021 1350   Sepsis Labs Invalid input(s): PROCALCITONIN,  WBC,  LACTICIDVEN Microbiology Recent Results (from the past 240 hour(s))  Urine Culture     Status: Abnormal   Collection Time: 07/12/21  1:50 PM   Specimen: Urine, Random  Result Value Ref Range Status   Specimen Description   Final    URINE, RANDOM Performed at [facility], [address] [place], [place] [address]    Special Requests   Final    NONE Performed at [facility], [address], [place],  [address]    Culture 90,000 COLONIES/mL STAPHYLOCOCCUS EPIDERMIDIS (A)  Final   Report Status 07/14/2021 FINAL  Final   Organism ID, Bacteria STAPHYLOCOCCUS EPIDERMIDIS (A)  Final      Susceptibility   Staphylococcus epidermidis - MIC*    CIPROFLOXACIN <=0.5 SENSITIVE Sensitive     GENTAMICIN <=0.5 SENSITIVE Sensitive     NITROFURANTOIN <=16 SENSITIVE Sensitive     OXACILLIN <=0.25 SENSITIVE Sensitive     TETRACYCLINE >=16 RESISTANT Resistant     VANCOMYCIN 1 SENSITIVE Sensitive     TRIMETH/SULFA <=10 SENSITIVE Sensitive     CLINDAMYCIN <=0.25 SENSITIVE Sensitive     RIFAMPIN <=0.5 SENSITIVE Sensitive     Inducible Clindamycin NEGATIVE Sensitive     * 90,000 COLONIES/mL STAPHYLOCOCCUS EPIDERMIDIS  Resp Panel by RT-PCR (Flu A&B, Covid) Nasopharyngeal Swab     Status: None   Collection Time: 07/12/21  3:33 PM   Specimen: Nasopharyngeal Swab; Nasopharyngeal(NP) swabs in vial transport medium  Result Value Ref Range Status   SARS  Coronavirus 2 by RT PCR NEGATIVE NEGATIVE Final    Comment: (NOTE) SARS-CoV-2 target nucleic acids are NOT DETECTED.  The SARS-CoV-2 RNA is generally detectable in upper respiratory specimens during the acute phase of infection. The lowest concentration of SARS-CoV-2 viral copies this assay can detect is 138 copies/mL. A negative result does not preclude SARS-Cov-2 infection and should not be used as the sole basis for treatment or other patient management decisions. A negative result may occur with  improper specimen collection/handling, submission of specimen other than nasopharyngeal swab, presence of viral mutation(s) within the areas targeted by this assay, and inadequate number of viral copies(<138 copies/mL). A negative result must be combined with clinical observations, patient history, and epidemiological information. The expected result is Negative.  Fact Sheet for Patients:  EntrepreneurPulse.com.au  Fact Sheet for Healthcare Providers:  IncredibleEmployment.be  This test is no  t yet approved or cleared by the [place] and  has been authorized for detection and/or diagnosis of SARS-CoV-2 by FDA under an Emergency Use Authorization (EUA). This EUA will remain  in effect (meaning this test can be used) for the duration of the COVID-19 declaration under Section 564(b)(1) of the Act, 21 U.S.C.section 360bbb-3(b)(1), unless the authorization is terminated  or revoked sooner.       Influenza A by PCR NEGATIVE NEGATIVE Final   Influenza B by PCR NEGATIVE NEGATIVE Final    Comment: (NOTE) The Xpert Xpress SARS-CoV-2/FLU/RSV plus assay is intended as an aid in the diagnosis of influenza from Nasopharyngeal swab specimens and should not be used as a sole basis for treatment. Nasal washings and aspirates are unacceptable for Xpert Xpress SARS-CoV-2/FLU/RSV testing.  Fact Sheet for  Patients: EntrepreneurPulse.com.au  Fact Sheet for Healthcare Providers: IncredibleEmployment.be  This test is not yet approved or cleared by the [place] FDA and has been authorized for detection and/or diagnosis of SARS-CoV-2 by FDA under an Emergency Use Authorization (EUA). This EUA will remain in effect (meaning this test can be used) for the duration of the COVID-19 declaration under Section 564(b)(1) of the Act, 21 U.S.C. section 360bbb-3(b)(1), unless the authorization is terminated or revoked.  Performed at [facility], [address] [place], [place] [address]   Culture, blood (Routine X 2) w Reflex to ID Panel     Status: Abnormal (Preliminary result)   Collection Time: 07/12/21  3:53 PM   Specimen: BLOOD  Result Value Ref Range Status   Specimen Description   Final    BLOOD LEFT ANTECUBITAL Performed at [facility], [address] [place], [place] [address]    Special Requests   Final    BOTTLES DRAWN AEROBIC AND ANAEROBIC Blood Culture adequate volume Performed at [facility], [address] [place], [place] [address]    Culture  Setup Time   Final    ANAEROBIC BOTTLE ONLY GRAM POSITIVE COCCI CRITICAL RESULT CALLED TO, READ BACK BY AND VERIFIED WITH: ABBY ELLINGTON @1115  07/13/21 MJU Performed at [facility], [address] [place], [place] [address]    Culture STAPHYLOCOCCUS EPIDERMIDIS (A)  Final   Report Status PENDING  Incomplete  Culture, blood (Routine X 2) w Reflex to ID Panel     Status: Abnormal (Preliminary result)   Collection Time: 07/12/21  3:53 PM   Specimen: BLOOD  Result Value Ref Range Status   Specimen Description   Final    BLOOD BLOOD RIGHT HAND Performed at [facility], [address], [place], [place] [address]    Special Requests   Final    BOTTLES DRAWN AEROBIC AND ANAEROBIC Blood Culture results may not be optimal due to an excessive  volume of blood received in culture bottles Performed at [facility], [address] [place], [place] [address]    Culture  Setup Time   Final    IN BOTH AEROBIC AND ANAEROBIC BOTTLES GRAM POSITIVE COCCI Organism ID to follow CRITICAL RESULT CALLED TO, READ BACK BY AND VERIFIED WITH: ABBY ELLINGTON @1115  07/13/21 MJU Performed at [facility], [address], [place], [place] [address]    Culture (A)  Final    STAPHYLOCOCCUS EPIDERMIDIS SUSCEPTIBILITIES TO FOLLOW Performed at [facility], [place] [address] [place], [place] [address]    Report Status PENDING  Incomplete  Blood Culture ID Panel (Reflexed)     Status: Abnormal  Collection Time: 07/12/21  3:53 PM  Result Value Ref Range Status   Enterococcus faecalis NOT DETECTED NOT DETECTED Final   Enterococcus Faecium NOT DETECTED NOT DETECTED Final   Listeria monocytogenes NOT DETECTED NOT DETECTED Final   Staphylococcus species DETECTED (A) NOT DETECTED Final    Comment: CRITICAL RESULT CALLED TO, READ BACK BY AND VERIFIED WITH: ABBY ELLINGTON @1115  07/13/21 MJU    Staphylococcus aureus (BCID) NOT DETECTED NOT DETECTED Final   Staphylococcus epidermidis DETECTED (A) NOT DETECTED Final    Comment: CRITICAL RESULT CALLED TO, READ BACK BY AND VERIFIED WITH: ABBY ELLINGTON @1115  07/13/21 MJU    Staphylococcus lugdunensis NOT DETECTED NOT DETECTED Final   Streptococcus species NOT DETECTED NOT DETECTED Final   Streptococcus agalactiae NOT DETECTED NOT DETECTED Final   Streptococcus pneumoniae NOT DETECTED NOT DETECTED Final   Streptococcus pyogenes NOT DETECTED NOT DETECTED Final   A.calcoaceticus-baumannii NOT DETECTED NOT DETECTED Final   Bacteroides fragilis NOT DETECTED NOT DETECTED Final   Enterobacterales NOT DETECTED NOT DETECTED Final   Enterobacter cloacae complex NOT DETECTED NOT DETECTED Final   Escherichia coli NOT DETECTED NOT DETECTED Final   Klebsiella aerogenes NOT DETECTED NOT DETECTED  Final   Klebsiella oxytoca NOT DETECTED NOT DETECTED Final   Klebsiella pneumoniae NOT DETECTED NOT DETECTED Final   Proteus species NOT DETECTED NOT DETECTED Final   Salmonella species NOT DETECTED NOT DETECTED Final   Serratia marcescens NOT DETECTED NOT DETECTED Final   Haemophilus influenzae NOT DETECTED NOT DETECTED Final   Neisseria meningitidis NOT DETECTED NOT DETECTED Final   Pseudomonas aeruginosa NOT DETECTED NOT DETECTED Final   Stenotrophomonas maltophilia NOT DETECTED NOT DETECTED Final   Candida albicans NOT DETECTED NOT DETECTED Final   Candida auris NOT DETECTED NOT DETECTED Final   Candida glabrata NOT DETECTED NOT DETECTED Final   Candida krusei NOT DETECTED NOT DETECTED Final   Candida parapsilosis NOT DETECTED NOT DETECTED Final   Candida tropicalis NOT DETECTED NOT DETECTED Final   Cryptococcus neoformans/gattii NOT DETECTED NOT DETECTED Final   Methicillin resistance mecA/C NOT DETECTED NOT DETECTED Final    Comment: Performed at [facility], [address], [place], [place] [address]  Urine Culture     Status: Abnormal (Preliminary result)   Collection Time: 07/13/21 11:07 AM   Specimen: PATH Other; Urine  Result Value Ref Range Status   Specimen Description   Final    CYSTOSCOPY Performed at [facility], [address] [place], [place] [address]    Special Requests   Final    urine culure via cystoscopy Performed at [facility], [address] [place], [place] [address]    Culture (A)  Final    >=100,000 COLONIES/mL STAPHYLOCOCCUS EPIDERMIDIS SUSCEPTIBILITIES TO FOLLOW Performed at [facility], [address], [place], [place] [address]    Report Status PENDING  Incomplete  CULTURE, BLOOD (ROUTINE X 2) w Reflex to ID Panel     Status: None (Preliminary result)   Collection Time: 07/14/21 11:01 AM   Specimen: BLOOD  Result Value Ref Range Status   Specimen Description BLOOD RIGHT HAND  Final   Special Requests    Final    BOTTLES DRAWN AEROBIC AND ANAEROBIC Blood Culture adequate volume   Culture   Final    NO GROWTH < 24 HOURS Performed at [facility], [address] [place], [place] [address]    Report Status PENDING  Incomplete  CULTURE, BLOOD (ROUTINE X 2) w  Reflex to ID Panel     Status: None (Preliminary result)   Collection Time: 07/14/21 11:01 AM   Specimen: BLOOD  Result Value Ref Range Status   Specimen Description BLOOD RIGHT [facility]  Final   Special Requests   Final    BOTTLES DRAWN AEROBIC AND ANAEROBIC Blood Culture adequate volume   Culture   Final    NO GROWTH < 24 HOURS Performed at [facility], [address], [place], [place] [address]    Report Status PENDING  Incomplete     Time coordinating discharge: Over 30 minutes  SIGNED:   Wyvonnia Dusky, MD  [facility] 07/15/2021, 2:50 PM Pager   If 7PM-7AM, please contact night-coverage

## 2021-07-15 NOTE — Progress Notes (Signed)
Patient being discharged home. IV removed. Went over discharge instructions and medications with patient and patients wife. Both stated they understood and all questions were answered. Patient going home POV with wife. Patient refused a wheelchair and wanted to walk with wife out. Explained that he could be too weak but patient insisted.

## 2021-07-16 ENCOUNTER — Telehealth: Payer: Self-pay | Admitting: Urology

## 2021-07-16 LAB — CULTURE, BLOOD (ROUTINE X 2): Special Requests: ADEQUATE

## 2021-07-16 LAB — URINE CULTURE: Culture: >=100000 — AB

## 2021-07-16 NOTE — Telephone Encounter (Signed)
LMOM for pt to call office to schedule appt per Sninsky's message:  appt in clinic next week with UA,IPSS, and PVR  On message pt said he was currently in hospital and would probably be there until right before Christmas.

## 2021-07-18 ENCOUNTER — Other Ambulatory Visit: Payer: Self-pay

## 2021-07-18 ENCOUNTER — Other Ambulatory Visit: Payer: Self-pay | Admitting: Urology

## 2021-07-18 ENCOUNTER — Encounter: Payer: Self-pay | Admitting: Urology

## 2021-07-18 ENCOUNTER — Ambulatory Visit: Payer: Managed Care, Other (non HMO) | Admitting: Urology

## 2021-07-18 VITALS — BP 143/76 | HR 76 | Ht 69.0 in | Wt 183.0 lb

## 2021-07-18 DIAGNOSIS — N200 Calculus of kidney: Secondary | ICD-10-CM

## 2021-07-18 DIAGNOSIS — N12 Tubulo-interstitial nephritis, not specified as acute or chronic: Secondary | ICD-10-CM | POA: Diagnosis not present

## 2021-07-18 DIAGNOSIS — N401 Enlarged prostate with lower urinary tract symptoms: Secondary | ICD-10-CM

## 2021-07-18 DIAGNOSIS — N210 Calculus in bladder: Secondary | ICD-10-CM

## 2021-07-18 DIAGNOSIS — N1330 Unspecified hydronephrosis: Secondary | ICD-10-CM

## 2021-07-18 DIAGNOSIS — N138 Other obstructive and reflux uropathy: Secondary | ICD-10-CM

## 2021-07-18 LAB — BLADDER SCAN AMB NON-IMAGING

## 2021-07-18 NOTE — Patient Instructions (Signed)
Ureteroscopy °Ureteroscopy is a procedure to check for and treat problems inside part of the urinary tract. In this procedure, a thin, flexible tube with a light at the end (ureteroscope) is used to look at the inside of the kidneys and the ureters. The ureters are the tubes that carry urine from the kidneys to the bladder. The ureteroscope is inserted into one or both of the ureters. °You may need this procedure if you have frequent urinary tract infections (UTIs), blood in your urine, or a stone in one of your ureters. A ureteroscopy can be done: °To find the cause of urine blockage in a ureter and to evaluate other abnormalities inside the ureters or kidneys. °To remove stones. °To remove or treat growths of tissue (polyps), abnormal tissue, and some types of tumors. °To remove a tissue sample and check it for disease under a microscope (biopsy). °Tell a health care provider about: °Any allergies you have. °All medicines you are taking, including vitamins, herbs, eye drops, creams, and over-the-counter medicines. °Any problems you or family members have had with anesthetic medicines. °Any blood disorders you have. °Any surgeries you have had. °Any medical conditions you have. °Whether you are pregnant or may be pregnant. °What are the risks? °Generally, this is a safe procedure. However, problems may occur, including: °Bleeding. °Infection. °Allergic reactions to medicines. °Scarring that narrows the ureter (stricture). °Creating a hole in the ureter (perforation). °What happens before the procedure? °Staying hydrated °Follow instructions from your health care provider about hydration, which may include: °Up to 2 hours before the procedure - you may continue to drink clear liquids, such as water, clear fruit juice, black coffee, and plain tea. ° °Eating and drinking restrictions °Follow instructions from your health care provider about eating and drinking, which may include: °8 hours before the procedure - stop  eating heavy meals or foods, such as meat, fried foods, or fatty foods. °6 hours before the procedure - stop eating light meals or foods, such as toast or cereal. °6 hours before the procedure - stop drinking milk or drinks that contain milk. °2 hours before the procedure - stop drinking clear liquids. °Medicines °Ask your health care provider about: °Changing or stopping your regular medicines. This is especially important if you are taking diabetes medicines or blood thinners. °Taking medicines such as aspirin and ibuprofen. These medicines can thin your blood. Do not take these medicines unless your health care provider tells you to take them. °Taking over-the-counter medicines, vitamins, herbs, and supplements. °General instructions °Do not use any products that contain nicotine or tobacco for at least 4 weeks before the procedure. These products include cigarettes, e-cigarettes, and chewing tobacco. If you need help quitting, ask your health care provider. °You may have a urine sample taken to check for infection. °Plan to have someone take you home from the hospital or clinic. °If you will be going home right after the procedure, plan to have someone with you for 24 hours. °Ask your health care provider what steps will be taken to help prevent infection. These may include: °Washing skin with a germ-killing soap. °Receiving antibiotic medicine. °What happens during the procedure? ° °An IV will be inserted into one of your veins. °You will be given one or more of the following: °A medicine to help you relax (sedative). °A medicine to make you fall asleep (general anesthetic). °A medicine that is injected into your spine to numb the area below and slightly above the injection site (spinal anesthetic). °The   part of your body that drains urine from your bladder (urethra) will be cleaned with a germ-killing solution. °The ureteroscope will be passed through your urethra into your bladder. °A salt-water solution will  be sent through the ureteroscope to fill your bladder. This will help the health care provider see the openings of your ureters more clearly. °The ureteroscope will be passed into your ureter. °If a growth is found, a biopsy may be done. °If a stone is found, it may be removed through the ureteroscope, or the stone may be broken up using a laser, shock waves, or electrical energy. °In some cases, if the ureter is too small, a tube may be inserted that keeps the ureter open (ureteral stent). The stent may be left in place for 1 or 2 weeks to keep the ureter open, and then the ureteroscopy procedure will be done. °The scope will be removed, and your bladder will be emptied. °The procedure may vary among health care providers and hospitals. °What can I expect after the procedure? °After your procedure, it is common to have: °Your blood pressure, heart rate, breathing rate, and blood oxygen level monitored until you leave the hospital or clinic. °A burning sensation when you urinate. You may be asked to urinate. °Blood in your urine. °Mild discomfort in your bladder area or kidney area when urinating. °A need to urinate more often or urgently. °Follow these instructions at home: °Medicines °Take over-the-counter and prescription medicines only as told by your health care provider. °If you were prescribed an antibiotic medicine, take it as told by your health care provider. Do not stop taking the antibiotic even if you start to feel better. °General instructions ° °If you were given a sedative during the procedure, it can affect you for several hours. Do not drive or operate machinery until your health care provider says that it is safe. °To relieve burning, take a warm bath or hold a warm washcloth over your groin. °Drink enough fluid to keep your urine pale yellow. °Drink two 8-ounce (237 mL) glasses of water every hour for the first 2 hours after you get home. °Continue to drink water often at home. °You can eat what  you normally do. °Keep all follow-up visits as told by your health care provider. This is important. °If you had a ureteral stent placed, ask your health care provider when you need to return to have it removed. °Contact a health care provider if you have: °Chills or a fever. °Burning pain for longer than 24 hours after the procedure. °Blood in your urine for longer than 24 hours after the procedure. °Get help right away if you have: °Large amounts of blood in your urine. °Blood clots in your urine. °Severe pain. °Chest pain or trouble breathing. °The feeling of a full bladder and you are unable to urinate. °These symptoms may represent a serious problem that is an emergency. Do not wait to see if the symptoms will go away. Get medical help right away. Call your local emergency services (911 in the [place] °Summary °Ureteroscopy is a procedure to check for and treat problems inside part of the urinary tract. °In this procedure, a thin, flexible tube with a light at the end (ureteroscope) is used to look at the inside of the kidneys and the ureters. °You may need this procedure if you have frequent urinary tract infections (UTIs), blood in your urine, or a stone in a ureter. °This information is not intended to replace advice given to   you by your health care provider. Make sure you discuss any questions you have with your health care provider. Document Revised: 03/28/2021 Document Reviewed: 04/21/2019 Elsevier Patient Education  2022 Else[employer] Inc.  Prostatic Urethral Lift Prostatic urethral lift is a surgical procedure to treat symptoms of prostate gland enlargement that occurs with age (benign prostatic hypertrophy, BPH). The urethra passes between the two lobes of the prostate. The urethra is the part of the body that drains urine from the bladder. As the prostate enlarges, it can push on the urethra and cause problems with urinating. This procedure involves placing an implant that holds the prostate away  from the urethra. The procedure is done using a thin device called a cystoscope. The device is inserted through the tip of the penis and moved up the urethra to the prostate. This is less invasive than other procedures that require an incision. You may have this procedure if: You have symptoms of BPH. Your prostate is not severely enlarged. Medicines to treat BPH are not working or not tolerated. You want to avoid possible sexual side effects from medicines or other procedures that are used to treat BPH. Tell a health care provider about: Any allergies you have. All medicines you are taking, including vitamins, herbs, eye drops, creams, and over-the-counter medicines. Any problems you or family members have had with anesthetic medicines. Any bleeding problems you have. Any surgeries you have had. Any medical conditions you have. What are the risks? Generally, this is a safe procedure. However, problems may occur, including: Bleeding. Infection. Leaking of urine (incontinence). Allergic reactions to medicines. Return of BPH symptoms after 2 years, requiring more treatment. What happens before the procedure? When to stop eating and drinking Follow instructions from your health care provider about what you may eat and drink before your procedure. These may include: 8 hours before your procedure Stop eating most foods. Do not eat meat, fried foods, or fatty foods. Eat only light foods, such as toast or crackers. All liquids are okay except energy drinks and alcohol. 6 hours before your procedure Stop eating. Drink only clear liquids, such as water, clear fruit juice, black coffee, plain tea, and sports drinks. Do not drink energy drinks or alcohol. 2 hours before your procedure Stop drinking all liquids. You may be allowed to take medicines with small sips of water. If you do not follow your health care provider's instructions, your procedure may be delayed or canceled. Medicines Ask  your health care provider about: Changing or stopping your regular medicines. This is especially important if you are taking diabetes medicines or blood thinners. Taking medicines such as aspirin and ibuprofen. These medicines can thin your blood. Do not take these medicines unless your health care provider tells you to take them. Taking over-the-counter medicines, vitamins, herbs, and supplements. Surgery safety Ask your health care provider what steps will be taken to help prevent infection. These steps may include: Removing Haberer at the surgery site. Washing skin with a germ-killing soap. Taking antibiotic medicine. General instructions Do not use any products that contain nicotine or tobacco for at least 4 weeks before the procedure. These products include cigarettes, chewing tobacco, and vaping devices, such as e-cigarettes. If you need help quitting, ask your health care provider. If you will be going home right after the procedure, plan to have a responsible adult: Take you home from the hospital or clinic. You will not be allowed to drive. Care for you for the time you are told. What happens  during the procedure? An IV may be inserted into one of your veins. You will be given one or more of the following: A medicine to help you relax (sedative). A medicine that is injected into your urethra to numb the area (local anesthetic). A medicine to make you fall asleep (general anesthetic). A cystoscope will be inserted into your penis and moved through your urethra to your prostate. A device will be inserted through the cystoscope and used to press the lobes of your prostate away from your urethra. Implants will be inserted through the device to hold the lobes of your prostate in the widened position. The device and cystoscope will be removed. The procedure may vary among health care providers and hospitals. What happens after the procedure? Your blood pressure, heart rate, breathing rate,  and blood oxygen level be monitored until you leave the hospital or clinic. If you were given a sedative during the procedure, it can affect you for several hours. Do not drive or operate machinery until your health care provider says that it is safe. Summary Prostatic urethral lift is a surgical procedure to relieve symptoms of prostate gland enlargement that occurs with age (benign prostatic hypertrophy, BPH). The procedure is performed with a thin device called a cystoscope. This device is inserted through the tip of the penis and moved up the urethra to reach the prostate. This is less invasive than other procedures that require an incision. If you will be going home right after the procedure, plan to have a responsible adult take you home from the hospital or clinic. You will not be allowed to drive. This information is not intended to replace advice given to you by your health care provider. Make sure you discuss any questions you have with your health care provider. Document Revised: 02/09/2021 Document Reviewed: 02/09/2021 Elsevier Patient Education  2022 [employer].

## 2021-07-18 NOTE — Progress Notes (Signed)
Surgical Physician Order Form [facility] [place]  * Scheduling expectation : 1/6 or 1/13  *Length of Case: 1.5 hours  *Clearance needed: no  *Anticoagulation Instructions: Hold all anticoagulants  *Aspirin Instructions: Hold Aspirin  *Post-op visit Date/Instructions: TBD  *Diagnosis: Left hydronephrosis, bladder stone, BPH  *Procedure: Cystoscopy, cystolitholapaxy, left diagnostic ureteroscopy, urolift   Additional orders: N/A  -Admit type: OUTpatient  -Anesthesia: General  -VTE Prophylaxis Standing Order SCDs       Other:   -Standing Lab Orders Per Anesthesia    Lab other: None  -Standing Test orders EKG/Chest x-ray per Anesthesia       Test other:   - Medications:  Cipro 400mg  IV  -Other orders:  N/A

## 2021-07-18 NOTE — Progress Notes (Signed)
° °  07/18/2021 3:30 PM   Ryan Pugh [birth year] [id number]  Reason for visit: Follow up bladder stone, left hydronephrosis, left pyelonephritis, BPH  HPI: 65 year old male with poorly controlled diabetes(hemoglobin A1c 8.8) and interesting presentation on 07/13/2019 with severe left-sided flank pain, mild left hydronephrosis, no ureteral stones, 3 cm bladder stone, and suspected sepsis from urinary source.  He underwent left ureteral stent placement at that time with purulent drainage from the left kidney, and there was a large bladder stone with a moderate size prostate with no median lobe.  Urine culture ultimately grew staph epidermis, and he is on culture appropriate Cipro.  He reports he has been feeling much better and really denies any complaints today.  At baseline he has urinary symptoms of weak stream, difficulty initiating urination, and feeling of incomplete emptying.  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  Moving forward, I recommended returning to the OR in 2 to 3 weeks for diagnostic left ureteroscopy to evaluate for any etiology of his left-sided hydronephrosis and obstruction/infection when no ureteral stone was visible on CT.  Simultaneously would plan for cystolitholapaxy as well as outlet procedure with UroLift with his 41 g prostate with high bladder neck.  Risks and benefits discussed extensively including bleeding, infection, recurrence of symptoms, and potential need for additional procedures in the future.  Complete course of Cipro for left pyelonephritis Schedule left diagnostic ureteroscopy, cystolitholapaxy, UroLift   Sondra Come, MD  [facility] [address], [address] [place], [place] [address] [phone]

## 2021-07-19 LAB — CULTURE, BLOOD (ROUTINE X 2)
Culture: NO GROWTH
Culture: NO GROWTH
Special Requests: ADEQUATE
Special Requests: ADEQUATE

## 2021-07-20 NOTE — Progress Notes (Signed)
[facility] Posting Form   Surgery Date/Time: Date: 08/03/2021  Surgeon: Dr. Legrand Rams, MD  Surgery Location: Day Surgery  Inpt ( No  )   Outpt (Yes)   Obs ( No  )   Diagnosis: N13.30 Left Hydronephrosis, N21.0 Bladder Stone, N40.1 N13.8, Benign Prostatic Hyperplasia  -CPT: [id number]  Surgery: Cystolitholapaxy with insertion of Urolift and left diagnostic ureteroscopy  Stop Anticoagulations: Yes, hold ASA  Cardiac/Medical/Pulmonary Clearance needed: no  *Orders entered into EPIC  Date: 07/20/21   *Case booked in [place]  Date: 07/19/2021  *Notified pt of Surgery: Date: 07/19/2021  PRE-OP UA & CX: no  *Placed into Prior Authorization Work [place] Date: 07/20/21   Assistant/laser/rep:No

## 2021-07-31 ENCOUNTER — Encounter
Admission: RE | Admit: 2021-07-31 | Discharge: 2021-07-31 | Disposition: A | Discharge status: Home / Self Care | Payer: Managed Care, Other (non HMO) | Source: Ambulatory Visit | Attending: Urology | Admitting: Urology

## 2021-07-31 ENCOUNTER — Other Ambulatory Visit: Payer: Self-pay

## 2021-07-31 HISTORY — DX: Benign prostatic hyperplasia without lower urinary tract symptoms: N40.0

## 2021-07-31 NOTE — Patient Instructions (Addendum)
Your procedure is scheduled on: Friday, January 6 Report to the Registration Desk on the 1st floor of the [facility] To find out your arrival time, please call [phone] between 1PM - 3PM on: Thursday, January 5  REMEMBER: Instructions that are not followed completely may result in serious medical risk, up to and including death; or upon the discretion of your surgeon and anesthesiologist your surgery may need to be rescheduled.  Do not eat or drink after midnight the night before surgery.  No gum chewing, lozengers or hard candies.  TAKE THESE MEDICATIONS THE MORNING OF SURGERY WITH A SIP OF WATER:  Oxycodone if needed for pain  Do not take any insulin on the day of surgery.  Stop metformin 2 days prior to surgery. Last day to take metformin is Tuesday, January 3. Resume AFTER surgery.  One week prior to surgery: Stop Anti-inflammatories (NSAIDS) such as Advil, Aleve, Ibuprofen, Motrin, Naproxen, Naprosyn and Aspirin based products such as Excedrin, Goodys Powder, BC Powder. Stop ANY OVER THE COUNTER supplements until after surgery. You may however, continue to take Tylenol if needed for pain up until the day of surgery.  No Alcohol for 24 hours before or after surgery.  No Smoking including e-cigarettes for 24 hours prior to surgery.  No chewable tobacco products for at least 6 hours prior to surgery.  No nicotine patches on the day of surgery.  Do not use any "recreational" drugs for at least a week prior to your surgery.  Please be advised that the combination of cocaine and anesthesia may have negative outcomes, up to and including death. If you test positive for cocaine, your surgery will be cancelled.  On the morning of surgery brush your teeth with toothpaste and water, you may rinse your mouth with mouthwash if you wish. Do not swallow any toothpaste or mouthwash.  Do not wear jewelry, make-up, hairpins, clips or nail polish.  Do not wear lotions, powders, or  perfumes.   Do not shave body from the neck down 48 hours prior to surgery just in case you cut yourself which could leave a site for infection.   Contact lenses, hearing aids and dentures may not be worn into surgery.  Do not bring valuables to the hospital. [facility] is not responsible for any missing/lost belongings or valuables.   Notify your doctor if there is any change in your medical condition (cold, fever, infection).  Wear comfortable clothing (specific to your surgery type) to the hospital.  After surgery, you can help prevent lung complications by doing breathing exercises.  Take deep breaths and cough every 1-2 hours. Your doctor may order a device called an Incentive Spirometer to help you take deep breaths.  If you are being discharged the day of surgery, you will not be allowed to drive home. You will need a responsible adult (18 years or older) to drive you home and stay with you that night.   If you are taking public transportation, you will need to have a responsible adult (18 years or older) with you. Please confirm with your physician that it is acceptable to use public transportation.   Please call the Pre-admissions Testing Dept. at [phone] if you have any questions about these instructions.  Surgery Visitation Policy:  Patients undergoing a surgery or procedure may have one family member or support person with them as long as that person is not COVID-19 positive or experiencing its symptoms.  That person may remain in the waiting  area during the procedure and may rotate out with other people.

## 2021-07-31 NOTE — Progress Notes (Signed)
°  Perioperative Services Pre-Admission/Anesthesia Testing   Date: 07/31/21 Name: Kamren Rykowski MRN:   [id number]  Re: Consideration of preoperative prophylactic antibiotic change   Request sent to: Billey Co, MD (routed and/or faxed via [facility])  Planned Surgical Procedure(s):    Case: [id number] Date/Time: 08/03/21 1331   Procedures:      CYSTOSCOPY WITH LITHOLAPAXY     URETEROSCOPY     CYSTOSCOPY WITH INSERTION OF UROLIFT   Anesthesia type: General   Pre-op diagnosis: hydronephrosis,bladder stone,BPH   Location: [facility] OR ROOM 10 / [place] [facility]S FOR ANESTHESIA GROUP   Surgeons: Billey Co, MD   Clinical Notes:  Patient has NO documented allergy to PCN   Request:  As an evidence based approach to reducing the rate of incidence for post-operative SSI and the development of MDROs, could an agent with narrower coverage for preoperative prophylaxis in this patient's upcoming surgical course be considered?   Currently ordered preoperative prophylactic ABX: ciprofloxacin.   Specifically requesting change to cephalosporin (CEFAZOLIN).   Please communicate decision with me and I will change the orders in Epic as per your direction.     Citation: Beckie Salts, Wardell Heath et al: Best practice statement on urologic procedures and antimicrobial prophylaxis. J Urol 2020; 203: 351.   Honor Loh, MSN, APRN, FNP-C, CEN [facility]  Peri-operative Services Nurse Practitioner FAX: [phone] 07/31/21 3:41 PM

## 2021-08-01 ENCOUNTER — Other Ambulatory Visit: Payer: Managed Care, Other (non HMO)

## 2021-08-02 ENCOUNTER — Encounter
Admission: RE | Admit: 2021-08-02 | Discharge: 2021-08-02 | Disposition: A | Discharge status: Home / Self Care | Payer: Managed Care, Other (non HMO) | Source: Ambulatory Visit | Attending: Urology | Admitting: Urology

## 2021-08-02 ENCOUNTER — Other Ambulatory Visit: Payer: Self-pay

## 2021-08-02 DIAGNOSIS — Z794 Long term (current) use of insulin: Secondary | ICD-10-CM | POA: Diagnosis not present

## 2021-08-02 DIAGNOSIS — E119 Type 2 diabetes mellitus without complications: Secondary | ICD-10-CM | POA: Insufficient documentation

## 2021-08-02 DIAGNOSIS — N401 Enlarged prostate with lower urinary tract symptoms: Secondary | ICD-10-CM | POA: Diagnosis not present

## 2021-08-02 DIAGNOSIS — N183 Chronic kidney disease, stage 3 unspecified: Secondary | ICD-10-CM | POA: Diagnosis not present

## 2021-08-02 DIAGNOSIS — E1165 Type 2 diabetes mellitus with hyperglycemia: Secondary | ICD-10-CM | POA: Diagnosis not present

## 2021-08-02 DIAGNOSIS — N132 Hydronephrosis with renal and ureteral calculous obstruction: Secondary | ICD-10-CM | POA: Diagnosis not present

## 2021-08-02 DIAGNOSIS — Z01810 Encounter for preprocedural cardiovascular examination: Secondary | ICD-10-CM | POA: Insufficient documentation

## 2021-08-02 DIAGNOSIS — N138 Other obstructive and reflux uropathy: Secondary | ICD-10-CM | POA: Diagnosis not present

## 2021-08-02 DIAGNOSIS — Z7984 Long term (current) use of oral hypoglycemic drugs: Secondary | ICD-10-CM | POA: Diagnosis not present

## 2021-08-02 DIAGNOSIS — N210 Calculus in bladder: Secondary | ICD-10-CM | POA: Diagnosis present

## 2021-08-02 DIAGNOSIS — E1122 Type 2 diabetes mellitus with diabetic chronic kidney disease: Secondary | ICD-10-CM | POA: Diagnosis not present

## 2021-08-03 ENCOUNTER — Ambulatory Visit: Payer: Managed Care, Other (non HMO)

## 2021-08-03 ENCOUNTER — Encounter: Payer: Self-pay | Admitting: Urology

## 2021-08-03 ENCOUNTER — Ambulatory Visit: Payer: Managed Care, Other (non HMO) | Admitting: Certified Registered Nurse Anesthetist

## 2021-08-03 ENCOUNTER — Ambulatory Visit
Admission: RE | Admit: 2021-08-03 | Discharge: 2021-08-03 | Disposition: A | Discharge status: Home / Self Care | Payer: Managed Care, Other (non HMO) | Attending: Urology | Admitting: Urology

## 2021-08-03 ENCOUNTER — Other Ambulatory Visit: Payer: Self-pay

## 2021-08-03 ENCOUNTER — Encounter
Admission: RE | Disposition: A | Discharge status: Home / Self Care | Payer: Self-pay | Source: Home / Self Care | Attending: Urology

## 2021-08-03 DIAGNOSIS — N183 Chronic kidney disease, stage 3 unspecified: Secondary | ICD-10-CM | POA: Insufficient documentation

## 2021-08-03 DIAGNOSIS — E1122 Type 2 diabetes mellitus with diabetic chronic kidney disease: Secondary | ICD-10-CM | POA: Diagnosis not present

## 2021-08-03 DIAGNOSIS — N401 Enlarged prostate with lower urinary tract symptoms: Secondary | ICD-10-CM | POA: Diagnosis not present

## 2021-08-03 DIAGNOSIS — E1165 Type 2 diabetes mellitus with hyperglycemia: Secondary | ICD-10-CM | POA: Diagnosis not present

## 2021-08-03 DIAGNOSIS — Z794 Long term (current) use of insulin: Secondary | ICD-10-CM | POA: Insufficient documentation

## 2021-08-03 DIAGNOSIS — N200 Calculus of kidney: Secondary | ICD-10-CM | POA: Diagnosis not present

## 2021-08-03 DIAGNOSIS — Z7984 Long term (current) use of oral hypoglycemic drugs: Secondary | ICD-10-CM | POA: Insufficient documentation

## 2021-08-03 DIAGNOSIS — N210 Calculus in bladder: Secondary | ICD-10-CM | POA: Insufficient documentation

## 2021-08-03 DIAGNOSIS — N1330 Unspecified hydronephrosis: Secondary | ICD-10-CM

## 2021-08-03 DIAGNOSIS — N138 Other obstructive and reflux uropathy: Secondary | ICD-10-CM | POA: Diagnosis not present

## 2021-08-03 DIAGNOSIS — N132 Hydronephrosis with renal and ureteral calculous obstruction: Secondary | ICD-10-CM | POA: Diagnosis not present

## 2021-08-03 HISTORY — PX: CYSTOSCOPY W/ RETROGRADES: SHX1426

## 2021-08-03 HISTORY — PX: CYSTOSCOPY WITH LITHOLAPAXY: SHX1425

## 2021-08-03 HISTORY — PX: CYSTOSCOPY WITH INSERTION OF UROLIFT: SHX6678

## 2021-08-03 HISTORY — PX: URETEROSCOPY: SHX842

## 2021-08-03 LAB — GLUCOSE, CAPILLARY
Glucose-Capillary: 247 mg/dL — ABNORMAL HIGH (ref 70–99)
Glucose-Capillary: 229 mg/dL — ABNORMAL HIGH (ref 70–99)

## 2021-08-03 SURGERY — CYSTOSCOPY, WITH BLADDER CALCULUS LITHOLAPAXY
Anesthesia: General

## 2021-08-03 MED ORDER — LIDOCAINE HCL (PF) 2 % IJ SOLN
INTRAMUSCULAR | Status: AC
  Filled 2021-08-03: qty 5

## 2021-08-03 MED ORDER — FENTANYL CITRATE (PF) 100 MCG/2ML IJ SOLN
INTRAMUSCULAR | Status: DC | PRN
  Administered 2021-08-03 (×2): 50 ug via INTRAVENOUS

## 2021-08-03 MED ORDER — OXYCODONE-ACETAMINOPHEN 5-325 MG PO TABS: 1.0000 | ORAL_TABLET | ORAL | 0 refills | Status: AC | PRN

## 2021-08-03 MED ORDER — PROPOFOL 10 MG/ML IV BOLUS
INTRAVENOUS | Status: DC | PRN
Start: 2021-08-03 — End: 2021-08-03
  Administered 2021-08-03: 160 mg via INTRAVENOUS

## 2021-08-03 MED ORDER — FAMOTIDINE 20 MG PO TABS: 20.0000 mg | ORAL_TABLET | Freq: Once | ORAL | Status: AC

## 2021-08-03 MED ORDER — SUGAMMADEX SODIUM 200 MG/2ML IV SOLN
INTRAVENOUS | Status: DC | PRN
  Administered 2021-08-03: 166 mg via INTRAVENOUS

## 2021-08-03 MED ORDER — PROMETHAZINE HCL 25 MG/ML IJ SOLN: 6.2500 mg | INTRAMUSCULAR | Status: DC | PRN

## 2021-08-03 MED ORDER — OXYCODONE-ACETAMINOPHEN 5-325 MG PO TABS: 1.0000 | ORAL_TABLET | ORAL | Status: DC | PRN

## 2021-08-03 MED ORDER — CIPROFLOXACIN IN D5W 400 MG/200ML IV SOLN
INTRAVENOUS | Status: AC
  Filled 2021-08-03: qty 200

## 2021-08-03 MED ORDER — ORAL CARE MOUTH RINSE: 15.0000 mL | Freq: Once | OROMUCOSAL | Status: AC

## 2021-08-03 MED ORDER — ONDANSETRON HCL 4 MG/2ML IJ SOLN
INTRAMUSCULAR | Status: AC
  Filled 2021-08-03: qty 2

## 2021-08-03 MED ORDER — CIPROFLOXACIN IN D5W 400 MG/200ML IV SOLN
400.0000 mg | INTRAVENOUS | Status: AC
  Administered 2021-08-03: 400 mg via INTRAVENOUS

## 2021-08-03 MED ORDER — ROCURONIUM BROMIDE 10 MG/ML (PF) SYRINGE
PREFILLED_SYRINGE | INTRAVENOUS | Status: AC
  Filled 2021-08-03: qty 10

## 2021-08-03 MED ORDER — ROCURONIUM BROMIDE 100 MG/10ML IV SOLN
INTRAVENOUS | Status: DC | PRN
  Administered 2021-08-03: 50 mg via INTRAVENOUS

## 2021-08-03 MED ORDER — LIDOCAINE HCL (CARDIAC) PF 100 MG/5ML IV SOSY
PREFILLED_SYRINGE | INTRAVENOUS | Status: DC | PRN
  Administered 2021-08-03: 100 mg via INTRAVENOUS

## 2021-08-03 MED ORDER — MIDAZOLAM HCL 2 MG/2ML IJ SOLN
INTRAMUSCULAR | Status: DC | PRN
  Administered 2021-08-03: 2 mg via INTRAVENOUS

## 2021-08-03 MED ORDER — FENTANYL CITRATE (PF) 100 MCG/2ML IJ SOLN
25.0000 ug | INTRAMUSCULAR | Status: DC | PRN
  Administered 2021-08-03: 50 ug via INTRAVENOUS

## 2021-08-03 MED ORDER — MIDAZOLAM HCL 2 MG/2ML IJ SOLN
INTRAMUSCULAR | Status: AC
  Filled 2021-08-03: qty 2

## 2021-08-03 MED ORDER — DEXAMETHASONE SODIUM PHOSPHATE 10 MG/ML IJ SOLN
INTRAMUSCULAR | Status: AC
  Filled 2021-08-03: qty 1

## 2021-08-03 MED ORDER — SODIUM CHLORIDE 0.9 % IR SOLN
Status: DC | PRN
  Administered 2021-08-03: 6000 mL via INTRAVESICAL

## 2021-08-03 MED ORDER — FAMOTIDINE 20 MG PO TABS
ORAL_TABLET | ORAL | Status: AC
  Administered 2021-08-03: 20 mg via ORAL
  Filled 2021-08-03: qty 1

## 2021-08-03 MED ORDER — SODIUM CHLORIDE 0.9 % IV SOLN: INTRAVENOUS | Status: DC

## 2021-08-03 MED ORDER — PHENYLEPHRINE HCL (PRESSORS) 10 MG/ML IV SOLN
INTRAVENOUS | Status: DC | PRN
  Administered 2021-08-03: 240 ug via INTRAVENOUS
  Administered 2021-08-03: 160 ug via INTRAVENOUS
  Administered 2021-08-03: 80 ug via INTRAVENOUS
  Administered 2021-08-03 (×3): 160 ug via INTRAVENOUS
  Administered 2021-08-03: 240 ug via INTRAVENOUS

## 2021-08-03 MED ORDER — FENTANYL CITRATE (PF) 100 MCG/2ML IJ SOLN
INTRAMUSCULAR | Status: AC
  Filled 2021-08-03: qty 2

## 2021-08-03 MED ORDER — CHLORHEXIDINE GLUCONATE 0.12 % MT SOLN: 15.0000 mL | Freq: Once | OROMUCOSAL | Status: AC

## 2021-08-03 MED ORDER — FENTANYL CITRATE (PF) 100 MCG/2ML IJ SOLN
INTRAMUSCULAR | Status: AC
  Administered 2021-08-03: 25 ug via INTRAVENOUS
  Filled 2021-08-03: qty 2

## 2021-08-03 MED ORDER — INSULIN ASPART 100 UNIT/ML IJ SOLN
INTRAMUSCULAR | Status: AC
  Filled 2021-08-03: qty 1

## 2021-08-03 MED ORDER — INSULIN ASPART 100 UNIT/ML IJ SOLN
3.0000 [IU] | Freq: Once | INTRAMUSCULAR | Status: AC
  Administered 2021-08-03: 3 [IU] via SUBCUTANEOUS

## 2021-08-03 MED ORDER — ACETAMINOPHEN 10 MG/ML IV SOLN
INTRAVENOUS | Status: AC
  Filled 2021-08-03: qty 100

## 2021-08-03 MED ORDER — CHLORHEXIDINE GLUCONATE 0.12 % MT SOLN
OROMUCOSAL | Status: AC
  Administered 2021-08-03: 15 mL via OROMUCOSAL
  Filled 2021-08-03: qty 15

## 2021-08-03 MED ORDER — IOHEXOL 180 MG/ML  SOLN
INTRAMUSCULAR | Status: DC | PRN
  Administered 2021-08-03: 10 mL

## 2021-08-03 MED ORDER — DEXAMETHASONE SODIUM PHOSPHATE 10 MG/ML IJ SOLN
INTRAMUSCULAR | Status: DC | PRN
  Administered 2021-08-03: 4 mg via INTRAVENOUS

## 2021-08-03 MED ORDER — ACETAMINOPHEN 10 MG/ML IV SOLN
INTRAVENOUS | Status: DC | PRN
  Administered 2021-08-03: 1000 mg via INTRAVENOUS

## 2021-08-03 SURGICAL SUPPLY — 42 items
BAG DRAIN CYSTO-URO LG1000N (MISCELLANEOUS) ×3 IMPLANT
BASKET ZERO TIP 1.9FR (BASKET) ×1 IMPLANT
BRUSH SCRUB EZ  4% CHG (MISCELLANEOUS) ×1
BRUSH SCRUB EZ 1% IODOPHOR (MISCELLANEOUS) ×3 IMPLANT
BRUSH SCRUB EZ 4% CHG (MISCELLANEOUS) ×2 IMPLANT
BSKT STON RTRVL ZERO TP 1.9FR (BASKET) ×2
BULB IRRIG PATHFIND (MISCELLANEOUS) IMPLANT
CATH URET FLEX-TIP 2 LUMEN 10F (CATHETERS) IMPLANT
CATH URETL OPEN 5X70 (CATHETERS) ×2 IMPLANT
CNTNR SPEC 2.5X3XGRAD LEK (MISCELLANEOUS) ×2
CONT SPEC 4OZ STER OR WHT (MISCELLANEOUS) ×1
CONT SPEC 4OZ STRL OR WHT (MISCELLANEOUS) ×2
CONTAINER SPEC 2.5X3XGRAD LEK (MISCELLANEOUS) ×2 IMPLANT
FIBER LASER FLEXIVA PULSE 550 (Laser) ×3 IMPLANT
GAUZE 4X4 16PLY ~~LOC~~+RFID DBL (SPONGE) ×6 IMPLANT
GLOVE SURG UNDER POLY LF SZ7.5 (GLOVE) ×3 IMPLANT
GOWN STRL REUS W/ TWL LRG LVL3 (GOWN DISPOSABLE) ×2 IMPLANT
GOWN STRL REUS W/ TWL XL LVL3 (GOWN DISPOSABLE) ×2 IMPLANT
GOWN STRL REUS W/TWL LRG LVL3 (GOWN DISPOSABLE) ×3
GOWN STRL REUS W/TWL XL LVL3 (GOWN DISPOSABLE) ×3
GUIDEWIRE GREEN .038 145CM (MISCELLANEOUS) ×4 IMPLANT
GUIDEWIRE STR DUAL SENSOR (WIRE) ×3 IMPLANT
INFUSOR MANOMETER BAG 3000ML (MISCELLANEOUS) ×3 IMPLANT
IV NS IRRIG 3000ML ARTHROMATIC (IV SOLUTION) ×3 IMPLANT
KIT PROBE TRILOGY 3.9X350 (MISCELLANEOUS) IMPLANT
KIT TURNOVER CYSTO (KITS) ×3 IMPLANT
MANIFOLD NEPTUNE II (INSTRUMENTS) ×3 IMPLANT
PACK CYSTO AR (MISCELLANEOUS) ×3 IMPLANT
SET CYSTO W/LG BORE CLAMP LF (SET/KITS/TRAYS/PACK) ×3 IMPLANT
SET IRRIG Y TYPE TUR BLADDER L (SET/KITS/TRAYS/PACK) ×1 IMPLANT
SHEATH URETERAL 12FRX35CM (MISCELLANEOUS) IMPLANT
SURGILUBE 2OZ TUBE FLIPTOP (MISCELLANEOUS) ×3 IMPLANT
SYR TOOMEY IRRIG 70ML (MISCELLANEOUS) ×3
SYRINGE TOOMEY IRRIG 70ML (MISCELLANEOUS) ×2 IMPLANT
SYSTEM UROLIFT 2 CART W/ HNDL (Male Continence) ×3 IMPLANT
SYSTEM UROLIFT 2 CARTRIDGE (Male Continence) ×2 IMPLANT
TRACTIP FLEXIVA PULSE ID 200 (Laser) ×1 IMPLANT
TUBING ART PRESS 48 MALE/FEM (TUBING) IMPLANT
VALVE UROSEAL ADJ ENDO (VALVE) ×1 IMPLANT
WATER STERILE IRR 1000ML POUR (IV SOLUTION) ×3 IMPLANT
WATER STERILE IRR 3000ML UROMA (IV SOLUTION) ×3 IMPLANT
WATER STERILE IRR 500ML POUR (IV SOLUTION) ×3 IMPLANT

## 2021-08-03 NOTE — Progress Notes (Signed)
Patient was able to urinate. Was slightly bloody.

## 2021-08-03 NOTE — Anesthesia Procedure Notes (Signed)
Procedure Name: Intubation Date/Time: 08/03/2021 9:43 AM Performed by: Demetrius Charity, CRNA Pre-anesthesia Checklist: Patient identified, Patient being monitored, Timeout performed, Emergency Drugs available and Suction available Patient Re-evaluated:Patient Re-evaluated prior to induction Oxygen Delivery Method: Circle system utilized Preoxygenation: Pre-oxygenation with 100% oxygen Induction Type: IV induction Ventilation: Oral airway inserted - appropriate to patient size and Two handed mask ventilation required Laryngoscope Size: McGraph and 4 Grade View: Grade I Tube type: Oral Tube size: 7.0 mm Number of attempts: 1 Airway Equipment and Method: Stylet Placement Confirmation: ETT inserted through vocal cords under direct vision, positive ETCO2 and breath sounds checked- equal and bilateral Secured at: 22 cm Tube secured with: Tape Dental Injury: Teeth and Oropharynx as per pre-operative assessment

## 2021-08-03 NOTE — Op Note (Signed)
Date of procedure: 08/03/21  Preoperative diagnosis:  Left hydronephrosis Bladder stone, 3 cm BPH with obstruction  Postoperative diagnosis:  Left proximal ureteral stone Left renal stone Bladder stone, 3 cm BPH with obstruction  Procedure: Cystoscopy, left ureteral stent removal, left retrograde pyelogram with intraoperative interpretation Left ureteroscopy, laser lithotripsy of proximal ureteral stone, basket extraction Left ureteroscopy, laser lithotripsy of renal stone Cystolitholapaxy 3cm UroLift, 4 implants placed   Surgeon: Legrand Rams, MD  Anesthesia: General  Complications: None  Intraoperative findings:  1 .  Moderate size prostate with obstructing lateral lobes and high bladder neck, 3 cm yellow bladder stone 2.  Left matrix like proximal ureteral stone fragmented and basket extracted 3.  Small left renal stone dusted 4.  Contrast drained briskly after left retrograde pyelogram, and no stent placed 5.  Uncomplicated dusting of large 3 cm bladder stone, and irrigation of all fragments 6.  UroLift with 4 implants placed, open channel at conclusion  EBL: Minimal  Specimens: None  Drains: None  Indication: Ryan Pugh is a 66 y.o. patient who previously presented with left-sided flank pain and pyelonephritis with no definite stone seen on CT, and stent was placed at that time.  He was also found to have a 3 cm bladder stone with long history of weak stream and incomplete emptying.  After reviewing the management options for treatment, they elected to proceed with the above surgical procedure(s). We have discussed the potential benefits and risks of the procedure, side effects of the proposed treatment, the likelihood of the patient achieving the goals of the procedure, and any potential problems that might occur during the procedure or recuperation. Informed consent has been obtained.  Description of procedure:  The patient was taken to the operating room and  general anesthesia was induced. SCDs were placed for DVT prophylaxis. The patient was placed in the dorsal lithotomy position, prepped and draped in the usual sterile fashion, and preoperative antibiotics were administered. A preoperative time-out was performed.   A 21 French rigid cystoscope was used to intubate the urethra and a normal-appearing urethra was followed proximally to the bladder.  The prostate was moderate in size with a high bladder neck and obstructing lateral lobes.  Thorough cystoscopy revealed a large 3 cm yellow bladder stone at the base of the bladder, mild bladder trabeculations but no suspicious lesions, and the ureteral orifices were orthotopic bilaterally.  The left ureteral stent was grasped and pulled to the meatus and a sensor wire advanced through the stent up to the kidney and the old stent removed.  A semirigid ureteroscope was then advanced alongside the wire and a normal-appearing ureter was followed up to the proximal ureter where I identified a matrix like 6 mm stone that was quite fluffy.  This was fragmented in half with the 240 m laser fiber and settings of 0.5 J and 80 Hz, and both pieces were basket extracted into the bladder and removed.  A second sensor wire was added into the kidney through the ureteroscope, and a single channel flexible digital ureteroscope was advanced over the wire up to the kidney.  Thorough pyeloscopy revealed a small 2 mm stone in the midpole, and this was dusted on previously mentioned settings.  There was another small fragment of matrix like fluffy material and this was also dusted to <1 mm pieces.  Retrograde pyelogram showed no filling defects or extravasation.  Careful pullback ureteroscopy showed no residual ureteral debris, fragments, or injury.  On KUB, the contrast drained  briskly, and I opted not to place a stent.  I then used a 82 [place] resectoscope with the 500 m laser fiber on settings of 2.5 J and 40 Hz to methodically  dust the large 3 cm bladder stone.  All pieces were carefully irrigated free from the bladder until no stones or dust remained.  There was no bladder injury.  Finally, I used the 20 [place] UroLift sheath with the visual obturator.  I started on the right side by placing a UroLift implant at the apex 1 cm distal to the bladder neck.  An identical implant was placed on the patient's left side.  I then moved back to the verumontanum, and an additional implant was placed on the patient's right side just proximal to the Veru, and an identical procedure was performed on the left side.  At this point I re-examined the channel with the visual obturator and there was an excellent anterior channel there was no significant bleeding.   A total of 4 UroLift implants were placed.   Disposition: Stable to PACU  Plan: Must void prior to discharge Follow-up in 1 month with P[facility]  Legrand Rams, MD

## 2021-08-03 NOTE — H&P (Signed)
° °  08/03/21 9:17 AM   Ryan Pugh Dec 15, 1955 [id number]  CC: bladder stone, BPH, left hydronephrosis  HPI:  66 year old male with poorly controlled diabetes(hemoglobin A1c 8.8) and interesting presentation on 07/13/2019 with severe left-sided flank pain, mild left hydronephrosis, no ureteral stones, 3 cm bladder stone, and suspected sepsis from urinary source.  He underwent left ureteral stent placement at that time with purulent drainage from the left kidney, and there was a large bladder stone with a moderate size prostate with no median lobe.   Urine culture ultimately grew staph epidermis, and he is on culture appropriate Cipro.  He reports he has been feeling much better and really denies any complaints today.  At baseline he has urinary symptoms of weak stream, difficulty initiating urination, and feeling of incomplete emptying.  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    PMH: Past Medical History:  Diagnosis Date   BPH (benign prostatic hyperplasia)    CKD (chronic kidney disease), stage III ([place])    Diabetes mellitus without complication ([place])    type 2   HLD (hyperlipidemia)    Kidney stone     Surgical History: Past Surgical History:  Procedure Laterality Date   CYSTOSCOPY W/ URETERAL STENT PLACEMENT Left 07/13/2021   Procedure: CYSTOSCOPY WITH RETROGRADE PYELOGRAM/URETERAL STENT PLACEMENT;  Surgeon: Billey Co, MD;  Location: [facility] ORS;  Service: Urology;  Laterality: Left;   KIDNEY STONE SURGERY  1979     Family History: Family History  Problem Relation Age of Onset   Diabetes Mother    Diabetes Brother    Throat cancer Brother     Social History:  reports that he has never smoked. He has never used smokeless tobacco. He reports that he does not drink alcohol and does not use drugs.  Physical Exam: BP 113/74    Pulse 78    Temp (!) 97.5 F (36.4 C) (Temporal)    Resp 16    Ht 5\' 9"  (1.753 m)     Wt 83 kg    SpO2 100%    BMI 27.02 kg/m    Constitutional:  Alert and oriented, No acute distress. Cardiovascular: RRR Respiratory: CTA bilaterally GI: Abdomen is soft, nontender, nondistended, no abdominal masses   Laboratory Data: Culture 12/16 with staph epidermidis, treated with culture appropriate cipro  Assessment & Plan:   Moving forward, I recommended returning to the OR for diagnostic left ureteroscopy to evaluate for any etiology of his left-sided hydronephrosis and obstruction/infection when no ureteral stone was visible on CT.  Simultaneously would plan for cystolitholapaxy as well as outlet procedure with UroLift with his 41 g prostate with high bladder neck.  Risks and benefits discussed extensively including bleeding, infection, recurrence of symptoms, and potential need for additional procedures in the future.  We specifically discussed the risks ureteroscopy including bleeding, infection/sepsis, stent related symptoms including flank pain/urgency/frequency/incontinence/dysuria, ureteral injury, inability to access stone, or need for staged or additional procedures.  OR today for left diagnostic URS, cystolitholopaxy, and urolift   Nickolas Madrid, MD 08/03/2021  [facility] [address] [place], [place] [address] [phone]

## 2021-08-03 NOTE — Anesthesia Preprocedure Evaluation (Signed)
Anesthesia Evaluation  Patient identified by MRN, date of birth, ID band Patient awake    Reviewed: Allergy & Precautions, NPO status , Patient's Chart, lab work & pertinent test results  History of Anesthesia Complications Negative for: history of anesthetic complications  Airway Mallampati: I  TM Distance: >3 FB Neck ROM: full  Mouth opening: Limited Mouth Opening  Dental  (+) Teeth Intact, Loose, Dental Advidsory Given,    Pulmonary neg pulmonary ROS,    Pulmonary exam normal  + decreased breath sounds      Cardiovascular Exercise Tolerance: Good negative cardio ROS Normal cardiovascular exam Rhythm:Regular     Neuro/Psych Anxiety negative neurological ROS  negative psych ROS   GI/Hepatic negative GI ROS, Neg liver ROS,   Endo/Other  diabetes, Poorly Controlled, Type 1, Oral Hypoglycemic Agents, Insulin Dependent  Renal/GU Renal disease     Musculoskeletal negative musculoskeletal ROS (+)   Abdominal (+) + obese,   Peds negative pediatric ROS (+)  Hematology negative hematology ROS (+)   Anesthesia Other Findings Past Medical History: No date: CKD (chronic kidney disease), stage III (H[facility]) No date: Diabetes mellitus without complication ([facility]) No date: HLD (hyperlipidemia) No date: Kidney stone  Past Surgical History: No date: KIDNEY STONE SURGERY  BMI    Body Mass Index: 27.17 kg/m      Reproductive/Obstetrics negative OB ROS                             Anesthesia Physical  Anesthesia Plan  ASA: 2  Anesthesia Plan: General   Post-op Pain Management:    Induction: Intravenous  PONV Risk Score and Plan: 2 and Ondansetron, Dexamethasone, Midazolam and Treatment may vary due to age or medical condition  Airway Management Planned: Oral ETT  Additional Equipment:   Intra-op Plan:   Post-operative Plan: Extubation in OR  Informed Consent: I have reviewed the  patients History and Physical, chart, labs and discussed the procedure including the risks, benefits and alternatives for the proposed anesthesia with the patient or authorized representative who has indicated his/her understanding and acceptance.     Dental Advisory Given  Plan Discussed with: CRNA and Surgeon  Anesthesia Plan Comments:         Anesthesia Quick Evaluation

## 2021-08-03 NOTE — Transfer of Care (Signed)
Immediate Anesthesia Transfer of Care Note  Patient: Ryan Pugh  Procedure(s) Performed: CYSTOSCOPY WITH LITHOLAPAXY URETEROSCOPY CYSTOSCOPY WITH INSERTION OF UROLIFT CYSTOSCOPY WITH RETROGRADE PYELOGRAM  Patient Location: PACU  Anesthesia Type:General  Level of Consciousness: awake, alert  and oriented  Airway & Oxygen Therapy: Patient Spontanous Breathing and Patient connected to nasal cannula oxygen  Post-op Assessment: Report given to RN and Post -op Vital signs reviewed and stable  Post vital signs: Reviewed and stable  Last Vitals:  Vitals Value Taken Time  BP 148/90 08/03/21 1055  Temp    Pulse 93 08/03/21 1057  Resp 12 08/03/21 1057  SpO2 99 % 08/03/21 1057  Vitals shown include unvalidated device data.  Last Pain:  Vitals:   08/03/21 0814  TempSrc: Temporal  PainSc: 2          Complications: No notable events documented.

## 2021-08-06 ENCOUNTER — Encounter: Payer: Self-pay | Admitting: Urology

## 2021-08-08 NOTE — Anesthesia Postprocedure Evaluation (Signed)
Anesthesia Post Note  Patient: Ryan Pugh  Procedure(s) Performed: CYSTOSCOPY WITH LITHOLAPAXY URETEROSCOPY CYSTOSCOPY WITH INSERTION OF UROLIFT CYSTOSCOPY WITH RETROGRADE PYELOGRAM  Patient location during evaluation: PACU Anesthesia Type: General Level of consciousness: awake and alert Pain management: pain level controlled Vital Signs Assessment: post-procedure vital signs reviewed and stable Respiratory status: spontaneous breathing, nonlabored ventilation, respiratory function stable and patient connected to nasal cannula oxygen Cardiovascular status: blood pressure returned to baseline and stable Postop Assessment: no apparent nausea or vomiting Anesthetic complications: no   No notable events documented.   Last Vitals:  Vitals:   08/03/21 1300 08/03/21 1322  BP: (!) 142/74 (!) 160/84  Pulse: 81 93  Resp: 15 15  Temp: (!) 36.1 C   SpO2: 95% 99%    Last Pain:  Vitals:   08/03/21 1322  TempSrc:   PainSc: 3                  Yevette Edwards

## 2021-08-28 ENCOUNTER — Encounter: Payer: Self-pay | Admitting: Urology

## 2021-09-05 ENCOUNTER — Encounter: Payer: Managed Care, Other (non HMO) | Admitting: Urology

## 2021-09-19 ENCOUNTER — Encounter: Payer: Managed Care, Other (non HMO) | Admitting: Urology

## 2021-10-04 ENCOUNTER — Encounter: Payer: Managed Care, Other (non HMO) | Admitting: Urology

## 2022-01-30 ENCOUNTER — Emergency Department: Payer: Managed Care, Other (non HMO)

## 2022-01-30 ENCOUNTER — Emergency Department
Admission: EM | Admit: 2022-01-30 | Discharge: 2022-01-30 | Disposition: A | Discharge status: Home / Self Care | Payer: Managed Care, Other (non HMO) | Attending: Emergency Medicine | Admitting: Emergency Medicine

## 2022-01-30 ENCOUNTER — Encounter: Payer: Self-pay | Admitting: Emergency Medicine

## 2022-01-30 ENCOUNTER — Other Ambulatory Visit: Payer: Self-pay

## 2022-01-30 DIAGNOSIS — Z23 Encounter for immunization: Secondary | ICD-10-CM | POA: Insufficient documentation

## 2022-01-30 DIAGNOSIS — Z7984 Long term (current) use of oral hypoglycemic drugs: Secondary | ICD-10-CM | POA: Diagnosis not present

## 2022-01-30 DIAGNOSIS — S91331A Puncture wound without foreign body, right foot, initial encounter: Secondary | ICD-10-CM | POA: Diagnosis not present

## 2022-01-30 DIAGNOSIS — N1831 Chronic kidney disease, stage 3a: Secondary | ICD-10-CM | POA: Diagnosis not present

## 2022-01-30 DIAGNOSIS — T148XXA Other injury of unspecified body region, initial encounter: Secondary | ICD-10-CM

## 2022-01-30 DIAGNOSIS — E1122 Type 2 diabetes mellitus with diabetic chronic kidney disease: Secondary | ICD-10-CM | POA: Diagnosis not present

## 2022-01-30 DIAGNOSIS — S99921A Unspecified injury of right foot, initial encounter: Secondary | ICD-10-CM | POA: Diagnosis present

## 2022-01-30 DIAGNOSIS — Z794 Long term (current) use of insulin: Secondary | ICD-10-CM | POA: Insufficient documentation

## 2022-01-30 DIAGNOSIS — W268XXA Contact with other sharp object(s), not elsewhere classified, initial encounter: Secondary | ICD-10-CM | POA: Insufficient documentation

## 2022-01-30 MED ORDER — CIPROFLOXACIN HCL 500 MG PO TABS
500.0000 mg | ORAL_TABLET | Freq: Once | ORAL | Status: AC
  Administered 2022-01-30: 500 mg via ORAL
  Filled 2022-01-30: qty 1

## 2022-01-30 MED ORDER — OXYCODONE-ACETAMINOPHEN 5-325 MG PO TABS
1.0000 | ORAL_TABLET | ORAL | 0 refills | Status: DC | PRN
Start: 2022-01-30 — End: 2022-02-08

## 2022-01-30 MED ORDER — KETOROLAC TROMETHAMINE 60 MG/2ML IM SOLN
30.0000 mg | Freq: Once | INTRAMUSCULAR | Status: AC
  Administered 2022-01-30: 30 mg via INTRAMUSCULAR
  Filled 2022-01-30: qty 2

## 2022-01-30 MED ORDER — TETANUS-DIPHTH-ACELL PERTUSSIS 5-2.5-18.5 LF-MCG/0.5 IM SUSY
0.5000 mL | PREFILLED_SYRINGE | Freq: Once | INTRAMUSCULAR | Status: AC
  Administered 2022-01-30: 0.5 mL via INTRAMUSCULAR
  Filled 2022-01-30: qty 0.5

## 2022-01-30 MED ORDER — CIPROFLOXACIN HCL 500 MG PO TABS: 500.0000 mg | ORAL_TABLET | Freq: Two times a day (BID) | ORAL | 0 refills | Status: DC

## 2022-01-30 NOTE — ED Triage Notes (Signed)
Pt to ED via POV with c/o stepping on a piece of wood with an old nail late yesterday afternoon. Pt states immediately washed wound out with hydrogen peroxide at home. Pt states took Ibuprofen at home without relief.   Pt states drove himself to the ED.

## 2022-01-30 NOTE — ED Notes (Signed)
Pt DC to home. Dc instructions reviewed with all questions answered. Pt verbalized understanding.Use of crutches demonstrated with teachback method. Pt able to demonstrate proper use.  Pt assisted out of dept via wheelchair.

## 2022-01-30 NOTE — Discharge Instructions (Signed)
1.  Take antibiotic as prescribed (Cipro 500mg  twice daily x7 days). 2.  You may take Ibuprofen as needed for pain; Percocet as needed for more severe pain. 3.  Use crutches to help you balance as you walk. 4.  Return to the ER for worsening symptoms, persistent vomiting, fever, increased redness/swelling, purulent discharge or other concerns.

## 2022-01-30 NOTE — ED Provider Notes (Signed)
[facility] Provider Note    Event Date/Time   First MD Initiated Contact with Patient 01/30/22 0406     (approximate)   History   Foot Injury   HPI  Ryan Pugh is a 66 y.o. male who presents to the ED from home with a chief complaint of puncture wound.  Patient stepped on a piece of wood with an old nail yesterday evening.  He was wearing rubber soled flip-flops.  Tetanus is not up-to-date.  He immediately washed the wound with hydrogen peroxide and took Ibuprofen without relief of symptoms.  Presents with pain at the puncture site.  History of diabetes.  Denies fever, chills, redness or purulent discharge.     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    Severe sepsis ([place])      Past Surgical History   Past Surgical History:  Procedure Laterality Date   CYSTOSCOPY W/ RETROGRADES  08/03/2021   Procedure: CYSTOSCOPY WITH RETROGRADE PYELOGRAM;  Surgeon: Billey Co, MD;  Location: [facility] ORS;  Service: Urology;;   CYSTOSCOPY W/ URETERAL STENT PLACEMENT Left 07/13/2021   Procedure: CYSTOSCOPY WITH RETROGRADE PYELOGRAM/URETERAL STENT PLACEMENT;  Surgeon: Billey Co, MD;  Location: [facility] ORS;  Service: Urology;  Laterality: Left;   CYSTOSCOPY WITH INSERTION OF UROLIFT N/A 08/03/2021   Procedure: CYSTOSCOPY WITH INSERTION OF UROLIFT;  Surgeon: Billey Co, MD;  Location: [facility]  ORS;  Service: Urology;  Laterality: N/A;   CYSTOSCOPY WITH LITHOLAPAXY N/A 08/03/2021   Procedure: CYSTOSCOPY WITH LITHOLAPAXY;  Surgeon: Billey Co, MD;  Location: [facility] ORS;  Service: Urology;  Laterality: N/A;   KIDNEY STONE SURGERY  1979   URETEROSCOPY N/A 08/03/2021   Procedure: URETEROSCOPY;  Surgeon: Billey Co, MD;  Location: [facility] ORS;  Service: Urology;  Laterality: N/A;     Home Medications   Prior to Admission medications   Medication Sig Start Date End Date Taking? Authorizing Provider  ciprofloxacin (CIPRO) 500 MG tablet Take 1 tablet (500 mg total) by mouth 2 (two) times daily. 01/30/22  Yes Paulette Blanch, MD  oxyCODONE-acetaminophen (PERCOCET/ROXICET) 5-325 MG tablet Take 1 tablet by mouth every 4 (four) hours as needed for severe pain. 01/30/22  Yes Paulette Blanch, MD  cetirizine (ZYRTEC) 10 MG tablet Take 10 mg by mouth daily as needed for allergies. Patient not taking: Reported on 08/03/2021    [provider]  glimepiride (AMARYL) 4 MG tablet Take 4 mg by mouth 2 (two) times daily. 09/30/17   [provider]  insulin glargine (LANTUS SOLOSTAR) 100 UNIT/ML Solostar Pen Inject 14-17 Units into the skin daily. 03/22/16   [provider]  JANUVIA 100 MG tablet Take 100 mg by mouth daily. 04/24/21   [provider]  metFORMIN (GLUCOPHAGE) 500 MG tablet Take 1,000 mg by mouth 2 (two) times daily. 04/24/21   [provider]  ondansetron (ZOFRAN-ODT) 4 MG disintegrating tablet Take 1 tablet (4 mg total) by mouth every 8 (eight) hours as needed. 07/12/21   Fisher, Roselyn Bering, PA-C  simvastatin (ZOCOR) 40 MG tablet Take 40 mg by mouth at bedtime. 04/24/21   [provider]  tamsulosin (FLOMAX) 0.4 MG CAPS capsule Take 1 capsule (0.4 mg total) by mouth daily. Patient taking differently: Take 0.4 mg by mouth at bedtime. 07/12/21   Faythe Ghee, PA-C     Allergies  Patient has no known allergies.   Family History   Family History   Problem Relation Age of Onset   Diabetes Mother    Diabetes Brother    Throat cancer Brother      Physical Exam  Triage Vital Signs: ED Triage Vitals  Enc Vitals Group     BP 01/30/22 0224 (!) 175/89     Pulse Rate 01/30/22 0224 88     Resp 01/30/22 0224 18     Temp 01/30/22 0224 97.7 F (36.5 C)     Temp Source 01/30/22 0224 Oral     SpO2 01/30/22 0224 98 %     Weight 01/30/22 0224 185 lb (83.9 kg)     Height --      Head Circumference --      Peak Flow --      Pain Score 01/30/22 0227 9     Pain Loc --      Pain Edu? --      Excl. in GC? --     Updated Vital Signs: BP (!) 175/89 (BP Location: Left Arm)   Pulse 88   Temp 97.7 F (36.5 C) (Oral)   Resp 18   Wt 83.9 kg   SpO2 98%   BMI 27.32 kg/m    General: Awake, mild distress.  CV:  Good peripheral perfusion.  Resp:  Normal effort.  Abd:  No distention.  Other:  Right foot: Sole with puncture mark noted with mild surrounding swelling.  No warmth, erythema or fluctuance noted.  2+ distal pulses.  Brisk, less than 5-second capillary refill.   ED Results / Procedures / Treatments  Labs (all labs ordered are listed, but only abnormal results are displayed) Labs Reviewed - No data to display   EKG  None   RADIOLOGY Have independently visualized and interpreted patient's x-ray as well as noted the radiology interpretation:  Right foot x-ray: Negative   Official radiology report(s): DG Foot Complete Right  Result Date: 01/30/2022 CLINICAL DATA:  Possible foreign body EXAM: RIGHT FOOT COMPLETE - 3+ VIEW COMPARISON:  None Available. FINDINGS: There is no evidence of fracture or dislocation. There is no evidence of arthropathy or other focal bone abnormality. Soft tissues are unremarkable. IMPRESSION: Negative. Electronically Signed   By: Deatra Robinson M.D.   On: 01/30/2022 02:58     PROCEDURES:  Critical Care performed: No  Procedures   MEDICATIONS ORDERED IN ED: Medications  ketorolac  (TORADOL) injection 30 mg (has no administration in time range)  ciprofloxacin (CIPRO) tablet 500 mg (has no administration in time range)  Tdap (BOOSTRIX) injection 0.5 mL (has no administration in time range)     IMPRESSION / MDM / ASSESSMENT AND PLAN / ED COURSE  I reviewed the triage vital signs and the nursing notes.                             66 year old male presenting with  puncture wound.  Will update Tetanus, start Cipro.  IM Ketorolac now as patient drove to the ED.  Prescription for as needed Percocet for pain.  Patient will follow-up with his PCP.  Strict return precautions given.  Patient verbalizes understanding and agrees with plan of care.  Patient's presentation is most consistent with acute, uncomplicated illness.   FINAL CLINICAL IMPRESSION(S) / ED DIAGNOSES   Final diagnoses:  Puncture wound     Rx / DC Orders   ED Discharge Orders          Ordered    ciprofloxacin (CIPRO) 500 MG tablet  2 times daily        01/30/22 0412    oxyCODONE-acetaminophen (PERCOCET/ROXICET) 5-325 MG tablet  Every 4 hours PRN        01/30/22 0412             Note:  This document was prepared using Dragon voice recognition software and may include unintentional dictation errors.   Irean Hong, MD 01/30/22 [phone]

## 2022-02-04 ENCOUNTER — Inpatient Hospital Stay: Payer: Managed Care, Other (non HMO)

## 2022-02-04 ENCOUNTER — Inpatient Hospital Stay
Admission: EM | Admit: 2022-02-04 | Discharge: 2022-02-08 | DRG: 623 | Disposition: A | Discharge status: Home / Self Care | Payer: Managed Care, Other (non HMO) | Attending: Internal Medicine | Admitting: Internal Medicine

## 2022-02-04 ENCOUNTER — Other Ambulatory Visit: Payer: Self-pay

## 2022-02-04 ENCOUNTER — Emergency Department: Payer: Managed Care, Other (non HMO)

## 2022-02-04 DIAGNOSIS — Z833 Family history of diabetes mellitus: Secondary | ICD-10-CM | POA: Diagnosis not present

## 2022-02-04 DIAGNOSIS — E11628 Type 2 diabetes mellitus with other skin complications: Principal | ICD-10-CM | POA: Diagnosis present

## 2022-02-04 DIAGNOSIS — S91331A Puncture wound without foreign body, right foot, initial encounter: Secondary | ICD-10-CM | POA: Diagnosis present

## 2022-02-04 DIAGNOSIS — E1140 Type 2 diabetes mellitus with diabetic neuropathy, unspecified: Secondary | ICD-10-CM | POA: Diagnosis present

## 2022-02-04 DIAGNOSIS — E785 Hyperlipidemia, unspecified: Secondary | ICD-10-CM | POA: Diagnosis present

## 2022-02-04 DIAGNOSIS — N1831 Chronic kidney disease, stage 3a: Secondary | ICD-10-CM | POA: Diagnosis present

## 2022-02-04 DIAGNOSIS — L02611 Cutaneous abscess of right foot: Secondary | ICD-10-CM | POA: Diagnosis present

## 2022-02-04 DIAGNOSIS — E119 Type 2 diabetes mellitus without complications: Secondary | ICD-10-CM | POA: Diagnosis not present

## 2022-02-04 DIAGNOSIS — Z7984 Long term (current) use of oral hypoglycemic drugs: Secondary | ICD-10-CM

## 2022-02-04 DIAGNOSIS — E1122 Type 2 diabetes mellitus with diabetic chronic kidney disease: Secondary | ICD-10-CM | POA: Diagnosis present

## 2022-02-04 DIAGNOSIS — L089 Local infection of the skin and subcutaneous tissue, unspecified: Principal | ICD-10-CM

## 2022-02-04 DIAGNOSIS — T148XXA Other injury of unspecified body region, initial encounter: Secondary | ICD-10-CM | POA: Diagnosis present

## 2022-02-04 DIAGNOSIS — Z79899 Other long term (current) drug therapy: Secondary | ICD-10-CM

## 2022-02-04 DIAGNOSIS — W450XXA Nail entering through skin, initial encounter: Secondary | ICD-10-CM

## 2022-02-04 DIAGNOSIS — L03115 Cellulitis of right lower limb: Secondary | ICD-10-CM | POA: Diagnosis present

## 2022-02-04 DIAGNOSIS — Z794 Long term (current) use of insulin: Secondary | ICD-10-CM

## 2022-02-04 DIAGNOSIS — N183 Chronic kidney disease, stage 3 unspecified: Secondary | ICD-10-CM | POA: Diagnosis present

## 2022-02-04 DIAGNOSIS — N400 Enlarged prostate without lower urinary tract symptoms: Secondary | ICD-10-CM | POA: Diagnosis present

## 2022-02-04 LAB — CBC WITH DIFFERENTIAL/PLATELET
Abs Immature Granulocytes: 0.03 10*3/uL (ref 0.00–0.07)
Basophils Absolute: 0.1 10*3/uL (ref 0.0–0.1)
Basophils Relative: 1 %
Eosinophils Absolute: 0.6 10*3/uL — ABNORMAL HIGH (ref 0.0–0.5)
Eosinophils Relative: 7 %
HCT: 41.3 % (ref 39.0–52.0)
Hemoglobin: 13.9 g/dL (ref 13.0–17.0)
Immature Granulocytes: 0 %
Lymphocytes Relative: 20 %
Lymphs Abs: 1.6 10*3/uL (ref 0.7–4.0)
MCH: 29.6 pg (ref 26.0–34.0)
MCHC: 33.7 g/dL (ref 30.0–36.0)
MCV: 87.9 fL (ref 80.0–100.0)
Monocytes Absolute: 0.6 10*3/uL (ref 0.1–1.0)
Monocytes Relative: 7 %
Neutro Abs: 5.4 10*3/uL (ref 1.7–7.7)
Neutrophils Relative %: 65 %
Platelets: 281 10*3/uL (ref 150–400)
RBC: 4.7 MIL/uL (ref 4.22–5.81)
RDW: 12.3 % (ref 11.5–15.5)
WBC: 8.2 10*3/uL (ref 4.0–10.5)
nRBC: 0 % (ref 0.0–0.2)

## 2022-02-04 LAB — SEDIMENTATION RATE: Sed Rate: 17 mm/hr (ref 0–20)

## 2022-02-04 LAB — BASIC METABOLIC PANEL
Anion gap: 10 (ref 5–15)
BUN: 18 mg/dL (ref 8–23)
CO2: 23 mmol/L (ref 22–32)
Calcium: 9.3 mg/dL (ref 8.9–10.3)
Chloride: 103 mmol/L (ref 98–111)
Creatinine, Ser: 1.22 mg/dL (ref 0.61–1.24)
GFR, Estimated: >60 mL/min (ref >60)
Glucose, Bld: 107 mg/dL — ABNORMAL HIGH (ref 70–99)
Potassium: 4.2 mmol/L (ref 3.5–5.1)
Sodium: 136 mmol/L (ref 135–145)

## 2022-02-04 LAB — CBG MONITORING, ED: Glucose-Capillary: 120 mg/dL — ABNORMAL HIGH (ref 70–99)

## 2022-02-04 MED ORDER — MORPHINE SULFATE (PF) 2 MG/ML IV SOLN
2.0000 mg | Freq: Once | INTRAVENOUS | Status: AC
  Administered 2022-02-04: 2 mg via INTRAVENOUS
  Filled 2022-02-04: qty 1

## 2022-02-04 MED ORDER — SODIUM CHLORIDE 0.9 % IV SOLN
INTRAVENOUS | Status: AC
Start: 2022-02-04 — End: 2022-02-05

## 2022-02-04 MED ORDER — TAMSULOSIN HCL 0.4 MG PO CAPS: 0.4000 mg | ORAL_CAPSULE | Freq: Every day | ORAL | Status: DC

## 2022-02-04 MED ORDER — VANCOMYCIN HCL 750 MG/150ML IV SOLN
750.0000 mg | Freq: Two times a day (BID) | INTRAVENOUS | Status: DC
  Administered 2022-02-05 – 2022-02-06 (×3): 750 mg via INTRAVENOUS
  Filled 2022-02-04 (×4): qty 150

## 2022-02-04 MED ORDER — METRONIDAZOLE 500 MG/100ML IV SOLN
500.0000 mg | Freq: Two times a day (BID) | INTRAVENOUS | Status: DC
  Administered 2022-02-04 – 2022-02-07 (×6): 500 mg via INTRAVENOUS
  Filled 2022-02-04 (×7): qty 100

## 2022-02-04 MED ORDER — SIMVASTATIN 20 MG PO TABS
40.0000 mg | ORAL_TABLET | Freq: Every day | ORAL | Status: DC
  Administered 2022-02-04 – 2022-02-07 (×3): 40 mg via ORAL
  Filled 2022-02-04 (×3): qty 2

## 2022-02-04 MED ORDER — SODIUM CHLORIDE 0.9 % IV SOLN
2.0000 g | Freq: Three times a day (TID) | INTRAVENOUS | Status: DC
  Administered 2022-02-04 – 2022-02-08 (×10): 2 g via INTRAVENOUS
  Filled 2022-02-04: qty 12.5
  Filled 2022-02-04: qty 2
  Filled 2022-02-04: qty 12.5
  Filled 2022-02-04 (×2): qty 2
  Filled 2022-02-04 (×2): qty 12.5
  Filled 2022-02-04 (×2): qty 2
  Filled 2022-02-04 (×2): qty 12.5
  Filled 2022-02-04: qty 2
  Filled 2022-02-04 (×3): qty 12.5

## 2022-02-04 MED ORDER — GADOBUTROL 1 MMOL/ML IV SOLN
5.0000 mL | Freq: Once | INTRAVENOUS | Status: DC | PRN
Start: 2022-02-04 — End: 2022-02-08

## 2022-02-04 MED ORDER — VANCOMYCIN HCL 2000 MG/400ML IV SOLN
2000.0000 mg | Freq: Once | INTRAVENOUS | Status: AC
  Administered 2022-02-04: 2000 mg via INTRAVENOUS
  Filled 2022-02-04: qty 400

## 2022-02-04 MED ORDER — INSULIN ASPART 100 UNIT/ML IJ SOLN
0.0000 [IU] | Freq: Three times a day (TID) | INTRAMUSCULAR | Status: DC
  Administered 2022-02-05: 11 [IU] via SUBCUTANEOUS
  Administered 2022-02-06: 5 [IU] via SUBCUTANEOUS
  Administered 2022-02-06: 1 [IU] via SUBCUTANEOUS
  Administered 2022-02-06: 3 [IU] via SUBCUTANEOUS
  Administered 2022-02-07: 2 [IU] via SUBCUTANEOUS
  Administered 2022-02-07 – 2022-02-08 (×3): 3 [IU] via SUBCUTANEOUS
  Administered 2022-02-08: 5 [IU] via SUBCUTANEOUS
  Filled 2022-02-04 (×9): qty 1

## 2022-02-04 MED ORDER — GADOBUTROL 1 MMOL/ML IV SOLN
7.0000 mL | Freq: Once | INTRAVENOUS | Status: AC | PRN
  Administered 2022-02-04: 7 mL via INTRAVENOUS

## 2022-02-04 MED ORDER — OXYCODONE-ACETAMINOPHEN 5-325 MG PO TABS
1.0000 | ORAL_TABLET | ORAL | Status: DC | PRN
  Administered 2022-02-04 – 2022-02-05 (×2): 1 via ORAL
  Filled 2022-02-04 (×2): qty 1

## 2022-02-04 NOTE — ED Notes (Signed)
See triage note  Presents with possible infection of right foot  States he stepped on a nail about 6 days ago  Has been placed on Cipro  Foot is red and swollen  afebrile on arrival

## 2022-02-04 NOTE — Consult Note (Signed)
Pharmacy Antibiotic Note  Ryan Pugh is a 66 y.o. male admitted on 02/04/2022 with cellulitis/lower extremity wound.  Pharmacy has been consulted for vancomycin and cefepime dosing.  Plan: Continue metronidazole 500 mg IV Q12H Initiate cefepime 2 gram Q8H Vancomycin 2000 mg x 1 given in ED.  Initiate Vancomycin 750 mg Q12H. Goal AUC 400-550 Estimated AUC 444/Cmin: 13.8 Scr 1.22, IBW, Vd 0.72   Height: 5\' 9"  (175.3 cm) Weight: 83.9 kg (185 lb) IBW/kg (Calculated) : 70.7  Temp (24hrs), Avg:98.2 F (36.8 C), Min:98.2 F (36.8 C), Max:98.2 F (36.8 C)  Recent Labs  Lab 02/04/22 1330  WBC 8.2  CREATININE 1.22    Estimated Creatinine Clearance: 62 mL/min (by C-G formula based on SCr of 1.22 mg/dL).    No Known Allergies  Antimicrobials this admission: 7/10 cefepime >>  7/10 Metronidazole >>  7/10 Vancomycin >>  Dose adjustments this admission:   Microbiology results:   Thank you for allowing pharmacy to be a part of this patient's care.  04/07/22, PharmD, BCPS Clinical Pharmacist   02/04/2022 3:46 PM

## 2022-02-04 NOTE — Consult Note (Signed)
Reason for Consult: Puncture wound with abscess right foot. Referring Physician: [name] is an 66 y.o. male.  HPI: This is a 66 year old male who relates an injury on July 4 when he stepped on a nail wearing only a pair of flip-flops.  States he did have recent roofing work done last fall.  Initially presented to the emergency department on July 5 and was evaluated and placed on antibiotics.  Increasing redness and swelling and pain in the right foot and presented for reevaluation and was admitted for abscess.  Past Medical History:  Diagnosis Date   BPH (benign prostatic hyperplasia)    CKD (chronic kidney disease), stage III (HCC)    Diabetes mellitus without complication (HCC)    type 2   HLD (hyperlipidemia)    Kidney stone     Past Surgical History:  Procedure Laterality Date   CYSTOSCOPY W/ RETROGRADES  08/03/2021   Procedure: CYSTOSCOPY WITH RETROGRADE PYELOGRAM;  Surgeon: Sondra Come, MD;  Location: [facility] ORS;  Service: Urology;;   CYSTOSCOPY W/ URETERAL STENT PLACEMENT Left 07/13/2021   Procedure: CYSTOSCOPY WITH RETROGRADE PYELOGRAM/URETERAL STENT PLACEMENT;  Surgeon: Sondra Come, MD;  Location: [facility] ORS;  Service: Urology;  Laterality: Left;   CYSTOSCOPY WITH INSERTION OF UROLIFT N/A 08/03/2021   Procedure: CYSTOSCOPY WITH INSERTION OF UROLIFT;  Surgeon: Sondra Come, MD;  Location: [facility] ORS;  Service: Urology;  Laterality: N/A;   CYSTOSCOPY WITH LITHOLAPAXY N/A 08/03/2021   Procedure: CYSTOSCOPY WITH LITHOLAPAXY;  Surgeon: Sondra Come, MD;  Location: [facility] ORS;  Service: Urology;  Laterality: N/A;   KIDNEY STONE SURGERY  1979   URETEROSCOPY N/A 08/03/2021   Procedure: URETEROSCOPY;  Surgeon: Sondra Come, MD;  Location: [facility] ORS;  Service: Urology;  Laterality: N/A;    Family History  Problem Relation Age of Onset   Diabetes Mother    Diabetes Brother    Throat cancer Brother     Social History:  reports that he has never smoked. He has never  used smokeless tobacco. He reports that he does not drink alcohol and does not use drugs.  Allergies: No Known Allergies  Medications: Scheduled:  insulin aspart  0-15 Units Subcutaneous TID WC   simvastatin  40 mg Oral QHS    Results for orders placed or performed during the hospital encounter of 02/04/22 (from the past 48 hour(s))  CBC with Differential     Status: Abnormal   Collection Time: 02/04/22  1:30 PM  Result Value Ref Range   WBC 8.2 4.0 - 10.5 K/uL   RBC 4.70 4.22 - 5.81 MIL/uL   Hemoglobin 13.9 13.0 - 17.0 g/dL   HCT 29.5 18.8 - 41.6 %   MCV 87.9 80.0 - 100.0 fL   MCH 29.6 26.0 - 34.0 pg   MCHC 33.7 30.0 - 36.0 g/dL   RDW 60.6 30.1 - 60.1 %   Platelets 281 150 - 400 K/uL   nRBC 0.0 0.0 - 0.2 %   Neutrophils Relative % 65 %   Neutro Abs 5.4 1.7 - 7.7 K/uL   Lymphocytes Relative 20 %   Lymphs Abs 1.6 0.7 - 4.0 K/uL   Monocytes Relative 7 %   Monocytes Absolute 0.6 0.1 - 1.0 K/uL   Eosinophils Relative 7 %   Eosinophils Absolute 0.6 (H) 0.0 - 0.5 K/uL   Basophils Relative 1 %   Basophils Absolute 0.1 0.0 - 0.1 K/uL   Immature Granulocytes 0 %   Abs Immature Granulocytes  0.03 0.00 - 0.07 K/uL    Comment: Performed at [facility], [address] [place], [place] [id number]  Basic metabolic panel     Status: Abnormal   Collection Time: 02/04/22  1:30 PM  Result Value Ref Range   Sodium 136 135 - 145 mmol/L   Potassium 4.2 3.5 - 5.1 mmol/L   Chloride 103 98 - 111 mmol/L   CO2 23 22 - 32 mmol/L   Glucose, Bld 107 (H) 70 - 99 mg/dL    Comment: Glucose reference range applies only to samples taken after fasting for at least 8 hours.   BUN 18 8 - 23 mg/dL   Creatinine, Ser 1.22 0.61 - 1.24 mg/dL   Calcium 9.3 8.9 - 10.3 mg/dL   GFR, Estimated >60 >60 mL/min    Comment: (NOTE) Calculated using the CKD-EPI Creatinine Equation (2021)    Anion gap 10 5 - 15    Comment: Performed at [facility], [address] [place], [place] [address]  CBG  monitoring, ED     Status: Abnormal   Collection Time: 02/04/22  4:42 PM  Result Value Ref Range   Glucose-Capillary 120 (H) 70 - 99 mg/dL    Comment: Glucose reference range applies only to samples taken after fasting for at least 8 hours.    MR FOOT RIGHT W WO CONTRAST  Result Date: 02/04/2022 CLINICAL DATA:  Right foot pain, diabetes. EXAM: MRI OF THE RIGHT FOREFOOT WITHOUT AND WITH CONTRAST TECHNIQUE: Multiplanar, multisequence MR imaging of the right forefoot was performed before and after the administration of intravenous contrast. CONTRAST:  85mL GADAVIST GADOBUTROL 1 MMOL/ML IV SOLN COMPARISON:  Radiographs 02/04/2022 FINDINGS: Bones/Joint/Cartilage No significant marrow edema or enhancement indicate osteomyelitis. No bony destructive findings. Spurring at the first digit MTP joint with a small degenerative subcortical cyst along the plantar base of the proximal phalanx great toe. Spurring at the articulation of the first metatarsal in the sesamoids. Ligaments Lisfranc ligament intact. Muscles and Tendons Mild subcutaneous edema and enhancement along the flexor digitorum longus tendons. Soft tissues In the plantar subcutaneous tissues along the ball of the foot the level of the third digit there is abnormal edema signal surrounding a 5 by 5 mm T1 and T2 signal hypointensity suspicious for a small amount of subcutaneous gas. There is also small amount of cutaneous or subcutaneous gas further plantar on image 17 series 8. The appearance is compatible with soft tissue infection and/or puncture wound. This corresponds to the location of the suspected soft tissue gas on radiography. IMPRESSION: 1. Focal inflammation and a small amount soft tissue gas in the plantar subcutaneous tissues along the ball of the foot at the level of the third digit (about at the distal level of the proximal metaphysis of the third proximal phalanx on image 12 series 9). No large drainable abscess is observed. 2. Low-level  edema and enhancement tracking along the flexor digitorum longus tendon distally compatible with inflammation. No gas or drainable abscess tracks along the margins of the tendon. 3. Degenerative findings at the first MTP joint. Electronically Signed   By: Van Clines M.D.   On: 02/04/2022 16:04   DG Foot Complete Right  Result Date: 02/04/2022 CLINICAL DATA:  Pain and redness of the foot EXAM: RIGHT FOOT COMPLETE - 3+ VIEW COMPARISON:  None Available. FINDINGS: No acute fracture or dislocation. No aggressive osseous lesion. Normal alignment. Mild osteoarthritis of the first MTP joint. Soft tissue edema along the plantar aspect of  the right forefoot with a small amount air concerning for cellulitis with air which may be secondary to the puncture wound versus necrotizing infection. No radiopaque foreign body or soft tissue emphysema. Peripheral vascular atherosclerotic disease. IMPRESSION: 1.  No acute osseous injury of the right foot. 2. Soft tissue edema along the plantar aspect of the right forefoot with a small amount air concerning for cellulitis with air which may be secondary to the puncture wound versus necrotizing infection. Electronically Signed   By: Elige Ko M.D.   On: 02/04/2022 13:44    Review of Systems  Constitutional:  Negative for chills and fever.  HENT:  Negative for sinus pain and sore throat.   Respiratory:  Negative for cough and shortness of breath.   Cardiovascular:  Negative for chest pain and palpitations.  Gastrointestinal:  Negative for nausea and vomiting.  Endocrine: Negative for polydipsia and polyuria.  Genitourinary:  Negative for frequency and urgency.  Musculoskeletal:        Patient relates significant pain with his right foot secondary to recent puncture wound from a nail.  Skin:        Patient relates recent puncture wound to the right foot with increasing redness and swelling in the right foot.  States the redness and swelling has gone down some since  antibiotics have been initiated in the emergency department.  Neurological:        Patient does relate some numbness and paresthesias in the feet related to his diabetes  Psychiatric/Behavioral:  Negative for confusion. The patient is not nervous/anxious.    Blood pressure (!) 160/80, pulse 78, temperature 98.2 F (36.8 C), temperature source Oral, resp. rate 18, height 5\' 9"  (1.753 m), weight 83.9 kg, SpO2 98 %. Physical Exam Cardiovascular:     Comments: DP and PT pulses are fully palpable. Musculoskeletal:     Comments: Guarded range of motion in the right foot.  Muscle testing deferred.  Pain on palpation in the plantar forefoot.  Skin:    Comments: The skin is warm dry and supple.  There is some erythema and edema in the right foot, more significantly on the plantar aspect.  Puncture wound beneath the right third metatarsal area that has completely covered over with no active drainage at this point.  Neurological:     Comments: Protective threshold with a monofilament wire is intact and symmetric to the forefoot and all digits.  Proprioception is impaired.     Assessment/Plan: Assessment: 1.  Puncture wound right foot with cellulitis and abscess. 2.  Diabetes with some degree of neuropathy.  Plan: Discussed with the patient x-ray and MRI findings with a small abscessed area beneath the puncture wound site.  Does not appear to be any bone or joint involvement.  Discussed the need for incision and drainage of the puncture wound to try to clear out the infection in his foot.  Discussed possible risks and complications of the procedure including but not limited to inability of the wound to heal due to his diabetes or continued infection.  Discussed that he would need to be nonweightbearing with a plantar incision on his foot.  Questions invited and answered.  No guarantees can be given as to the outcome of surgery.  Patient elects to proceed with surgery and we will schedule this for tomorrow  afternoon.  Consent form for I&D puncture wound and abscess right foot.  N.p.o. after midnight.  Plan for surgery tomorrow afternoon.  02/04/2022, 6:01 PM

## 2022-02-04 NOTE — Assessment & Plan Note (Signed)
Stable Continue Flomax 

## 2022-02-04 NOTE — H&P (Addendum)
History and Physical    Patient: Ryan Pugh [id number] DOB: 1955/11/29 DOA: 02/04/2022 DOS: the patient was seen and examined on 02/04/2022 PCP: Su Grand, MD  Patient coming from: Home  Chief Complaint:  Chief Complaint  Patient presents with   Foot Pain   HPI: Ryan Pugh is a 66 y.o. male with medical history significant for diabetes mellitus, dyslipidemia, BPH who presents to the ER for evaluation of severe right foot pain which he rates an 8 x 10 in intensity at its worst associated with swelling. Patient was seen in the ER on 01/30/22 for evaluation of a puncture wound involving his right foot.  He stated that he stepped on a piece of wood with an old nail.  He was wearing rubber soled flip-flops when this happened.  He was seen in the ER and received a dose of tetanus toxoid.  Patient was discharged home on ciprofloxacin which he has taken as prescribed. He has had fever at home even though undocumented. He denies having any shortness of breath, no chest pain, no changes in his bowel habits, no dizziness, no lightheadedness, no cough, no headache, no blurred vision no focal deficit. Right foot x-ray was done which showed no acute osseous injury of the right foot.Soft tissue edema along the plantar aspect of the right forefoot with a small amount air concerning for cellulitis with air which may be secondary to the puncture wound versus necrotizing infection. Patient received a dose of vancomycin in the ER and will be admitted to the hospital for further evaluation.    Review of Systems: As mentioned in the history of present illness. All other systems reviewed and are negative. Past Medical History:  Diagnosis Date   BPH (benign prostatic hyperplasia)    CKD (chronic kidney disease), stage III ([place])    Diabetes mellitus without complication ([place])    type 2   HLD (hyperlipidemia)    Kidney stone    Past Surgical History:  Procedure Laterality Date   CYSTOSCOPY W/  RETROGRADES  08/03/2021   Procedure: CYSTOSCOPY WITH RETROGRADE PYELOGRAM;  Surgeon: Billey Co, MD;  Location: [facility];  Service: Urology;;   CYSTOSCOPY W/ URETERAL STENT PLACEMENT Left 07/13/2021   Procedure: CYSTOSCOPY WITH RETROGRADE PYELOGRAM/URETERAL STENT PLACEMENT;  Surgeon: Billey Co, MD;  Location: [facility];  Service: Urology;  Laterality: Left;   CYSTOSCOPY WITH INSERTION OF UROLIFT N/A 08/03/2021   Procedure: CYSTOSCOPY WITH INSERTION OF UROLIFT;  Surgeon: Billey Co, MD;  Location: [facility];  Service: Urology;  Laterality: N/A;   CYSTOSCOPY WITH LITHOLAPAXY N/A 08/03/2021   Procedure: CYSTOSCOPY WITH LITHOLAPAXY;  Surgeon: Billey Co, MD;  Location: [facility];  Service: Urology;  Laterality: N/A;   KIDNEY STONE SURGERY  1979   URETEROSCOPY N/A 08/03/2021   Procedure: URETEROSCOPY;  Surgeon: Billey Co, MD;  Location: [facility];  Service: Urology;  Laterality: N/A;   Social History:  reports that he has never smoked. He has never used smokeless tobacco. He reports that he does not drink alcohol and does not use drugs.  No Known Allergies  Family History  Problem Relation Age of Onset   Diabetes Mother    Diabetes Brother    Throat cancer Brother     Prior to Admission medications   Medication Sig Start Date End Date Taking? Authorizing Provider  ciprofloxacin (CIPRO) 500 MG tablet Take 1 tablet (500 mg total) by mouth 2 (two) times daily. 01/30/22  Yes Paulette Blanch, MD  glimepiride (AMARYL) 4 MG tablet Take 4 mg by mouth 2 (two) times daily. 09/30/17  Yes [provider]  ibuprofen (ADVIL) 200 MG tablet Take 200 mg by mouth every 6 (six) hours as needed for fever, mild pain or moderate pain.   Yes [provider]  JANUVIA 100 MG tablet Take 100 mg by mouth daily. 04/24/21  Yes [provider]  metFORMIN (GLUCOPHAGE) 500 MG tablet Take 1,000 mg by mouth 2 (two) times daily. 04/24/21  Yes [provider]   oxyCODONE-acetaminophen (PERCOCET/ROXICET) 5-325 MG tablet Take 1 tablet by mouth every 4 (four) hours as needed for severe pain. 01/30/22  Yes Irean Hong, MD  simvastatin (ZOCOR) 40 MG tablet Take 40 mg by mouth at bedtime. 04/24/21  Yes [provider]  cetirizine (ZYRTEC) 10 MG tablet Take 10 mg by mouth daily as needed for allergies. Patient not taking: Reported on 08/03/2021    [provider]  insulin glargine (LANTUS SOLOSTAR) 100 UNIT/ML Solostar Pen Inject 14-17 Units into the skin daily. Patient not taking: Reported on 02/04/2022 03/22/16   [provider]  ondansetron (ZOFRAN-ODT) 4 MG disintegrating tablet Take 1 tablet (4 mg total) by mouth every 8 (eight) hours as needed. Patient not taking: Reported on 02/04/2022 07/12/21   Faythe Ghee, PA-C  tamsulosin (FLOMAX) 0.4 MG CAPS capsule Take 1 capsule (0.4 mg total) by mouth daily. Patient not taking: Reported on 02/04/2022 07/12/21   Faythe Ghee, PA-C    Physical Exam: Vitals:   02/04/22 1208 02/04/22 1209 02/04/22 1503  BP: (!) 167/82  (!) 160/80  Pulse: 81  78  Resp: 18  18  Temp: 98.2 F (36.8 C)    TempSrc: Oral    SpO2: 97%  98%  Weight:  83.9 kg   Height:  5\' 9"  (1.753 m)    Physical Exam Vitals and nursing note reviewed.  Constitutional:      Appearance: Normal appearance.  HENT:     Head: Normocephalic and atraumatic.     Nose: Nose normal.     Mouth/Throat:     Mouth: Mucous membranes are moist.  Eyes:     Conjunctiva/sclera: Conjunctivae normal.  Cardiovascular:     Rate and Rhythm: Normal rate and regular rhythm.  Pulmonary:     Effort: Pulmonary effort is normal.     Breath sounds: Normal breath sounds.  Abdominal:     General: Abdomen is flat. Bowel sounds are normal.     Palpations: Abdomen is soft.  Musculoskeletal:        General: Swelling present.     Cervical back: Normal range of motion and neck supple.     Comments: Site of puncture wound under the plantar  surface of the right foot.  Right foot swelling  Skin:    General: Skin is warm and dry.  Neurological:     General: No focal deficit present.     Mental Status: He is alert.  Psychiatric:        Mood and Affect: Mood normal.        Behavior: Behavior normal.     Data Reviewed: Relevant notes from primary care and specialist visits, past discharge summaries as available in EHR, including Care Everywhere. Prior diagnostic testing as pertinent to current admission diagnoses Updated medications and problem lists for reconciliation ED course, including vitals, labs, imaging, treatment and response to treatment Triage notes, nursing and pharmacy notes and ED provider's notes Notable results as noted in  HPI Labs reviewed.  Sodium 136, potassium 4.2, chloride 103, bicarb 23, glucose 107, BUN 18, creatinine 1.2, calcium 9.3, white count 8.2, hemoglobin 13.9, hematocrit 41.3, RDW 12.3, platelet count 281 Right foot x-ray shows no acute osseous injury of the right foot. Soft tissue edema along the plantar aspect of the right forefoot with a small amount air concerning for cellulitis with air which may be secondary to the puncture wound versus necrotizing infection. There are no new results to review at this time.  Assessment and Plan: * Diabetic foot infection (HCC) Following a puncture wound Patient at increased risk for infection with Pseudomonas and MRSA We will treat patient empirically with cefepime and vancomycin Obtain MRI of the right foot to further evaluate soft tissue edema along the plantar aspect of the right forefoot with a small amount air concerning for cellulitis with air which may be secondary to the puncture wound versus necrotizing infection. Consult podiatry  Diabetes mellitus without complication (HCC) Hold oral hypoglycemic agent Place patient on consistent carbohydrate diet Glycemic control with sliding scale insulin      Advance Care Planning:   Code Status:  Full Code   Consults: Podiatry  Family Communication: Greater than 50% of time was spent discussing patient's condition and plan of care with him at the bedside.  All questions and concerns have been addressed.  He verbalizes understanding and agrees with the plan.  Severity of Illness: The appropriate patient status for this patient is INPATIENT. Inpatient status is judged to be reasonable and necessary in order to provide the required intensity of service to ensure the patient's safety. The patient's presenting symptoms, physical exam findings, and initial radiographic and laboratory data in the context of their chronic comorbidities is felt to place them at high risk for further clinical deterioration. Furthermore, it is not anticipated that the patient will be medically stable for discharge from the hospital within 2 midnights of admission.   * I certify that at the point of admission it is my clinical judgment that the patient will require inpatient hospital care spanning beyond 2 midnights from the point of admission due to high intensity of service, high risk for further deterioration and high frequency of surveillance required.*  Author: Lucile Shutters, MD 02/04/2022 3:25 PM  For on call review [email or web address].

## 2022-02-04 NOTE — Assessment & Plan Note (Addendum)
Home oral medications were held during admission, resumed at discharge. Patient was covered with [employer]. Primary care follow-up.

## 2022-02-04 NOTE — ED Notes (Signed)
Informed RN bed assigned 

## 2022-02-04 NOTE — ED Triage Notes (Signed)
Pt comes with c/o right foot pain. Pt states he was here few days ago for stepping on nail. Pt states he was on antibiotics and feels it hasn't gotten better. Pt denies any drainage.

## 2022-02-04 NOTE — H&P (View-Only) (Signed)
Reason for Consult: Puncture wound with abscess right foot. Referring Physician: Dorse Locy is an 66 y.o. male.  HPI: This is a 66 year old male who relates an injury on July 4 when he stepped on a nail wearing only a pair of flip-flops.  States he did have recent roofing work done last fall.  Initially presented to the emergency department on July 5 and was evaluated and placed on antibiotics.  Increasing redness and swelling and pain in the right foot and presented for reevaluation and was admitted for abscess.  Past Medical History:  Diagnosis Date   BPH (benign prostatic hyperplasia)    CKD (chronic kidney disease), stage III (HCC)    Diabetes mellitus without complication (HCC)    type 2   HLD (hyperlipidemia)    Kidney stone     Past Surgical History:  Procedure Laterality Date   CYSTOSCOPY W/ RETROGRADES  08/03/2021   Procedure: CYSTOSCOPY WITH RETROGRADE PYELOGRAM;  Surgeon: Sondra Come, MD;  Location: [facility];  Service: Urology;;   CYSTOSCOPY W/ URETERAL STENT PLACEMENT Left 07/13/2021   Procedure: CYSTOSCOPY WITH RETROGRADE PYELOGRAM/URETERAL STENT PLACEMENT;  Surgeon: Sondra Come, MD;  Location: [facility];  Service: Urology;  Laterality: Left;   CYSTOSCOPY WITH INSERTION OF UROLIFT N/A 08/03/2021   Procedure: CYSTOSCOPY WITH INSERTION OF UROLIFT;  Surgeon: Sondra Come, MD;  Location: [facility];  Service: Urology;  Laterality: N/A;   CYSTOSCOPY WITH LITHOLAPAXY N/A 08/03/2021   Procedure: CYSTOSCOPY WITH LITHOLAPAXY;  Surgeon: Sondra Come, MD;  Location: [facility];  Service: Urology;  Laterality: N/A;   KIDNEY STONE SURGERY  1979   URETEROSCOPY N/A 08/03/2021   Procedure: URETEROSCOPY;  Surgeon: Sondra Come, MD;  Location: [facility];  Service: Urology;  Laterality: N/A;    Family History  Problem Relation Age of Onset   Diabetes Mother    Diabetes Brother    Throat cancer Brother     Social History:  reports that he has never smoked. He has never  used smokeless tobacco. He reports that he does not drink alcohol and does not use drugs.  Allergies: No Known Allergies  Medications: Scheduled:  insulin aspart  0-15 Units Subcutaneous TID WC   simvastatin  40 mg Oral QHS    Results for orders placed or performed during the hospital encounter of 02/04/22 (from the past 48 hour(s))  CBC with Differential     Status: Abnormal   Collection Time: 02/04/22  1:30 PM  Result Value Ref Range   WBC 8.2 4.0 - 10.5 K/uL   RBC 4.70 4.22 - 5.81 MIL/uL   Hemoglobin 13.9 13.0 - 17.0 g/dL   HCT 29.5 18.8 - 41.6 %   MCV 87.9 80.0 - 100.0 fL   MCH 29.6 26.0 - 34.0 pg   MCHC 33.7 30.0 - 36.0 g/dL   RDW 60.6 30.1 - 60.1 %   Platelets 281 150 - 400 K/uL   nRBC 0.0 0.0 - 0.2 %   Neutrophils Relative % 65 %   Neutro Abs 5.4 1.7 - 7.7 K/uL   Lymphocytes Relative 20 %   Lymphs Abs 1.6 0.7 - 4.0 K/uL   Monocytes Relative 7 %   Monocytes Absolute 0.6 0.1 - 1.0 K/uL   Eosinophils Relative 7 %   Eosinophils Absolute 0.6 (H) 0.0 - 0.5 K/uL   Basophils Relative 1 %   Basophils Absolute 0.1 0.0 - 0.1 K/uL   Immature Granulocytes 0 %   Abs Immature Granulocytes  0.03 0.00 - 0.07 K/uL    Comment: Performed at [facility], [address] [place], [place] [id number]  Basic metabolic panel     Status: Abnormal   Collection Time: 02/04/22  1:30 PM  Result Value Ref Range   Sodium 136 135 - 145 mmol/L   Potassium 4.2 3.5 - 5.1 mmol/L   Chloride 103 98 - 111 mmol/L   CO2 23 22 - 32 mmol/L   Glucose, Bld 107 (H) 70 - 99 mg/dL    Comment: Glucose reference range applies only to samples taken after fasting for at least 8 hours.   BUN 18 8 - 23 mg/dL   Creatinine, Ser 1.22 0.61 - 1.24 mg/dL   Calcium 9.3 8.9 - 10.3 mg/dL   GFR, Estimated >60 >60 mL/min    Comment: (NOTE) Calculated using the CKD-EPI Creatinine Equation (2021)    Anion gap 10 5 - 15    Comment: Performed at [facility], [address] [place], [place] [address]  CBG  monitoring, ED     Status: Abnormal   Collection Time: 02/04/22  4:42 PM  Result Value Ref Range   Glucose-Capillary 120 (H) 70 - 99 mg/dL    Comment: Glucose reference range applies only to samples taken after fasting for at least 8 hours.    MR FOOT RIGHT W WO CONTRAST  Result Date: 02/04/2022 CLINICAL DATA:  Right foot pain, diabetes. EXAM: MRI OF THE RIGHT FOREFOOT WITHOUT AND WITH CONTRAST TECHNIQUE: Multiplanar, multisequence MR imaging of the right forefoot was performed before and after the administration of intravenous contrast. CONTRAST:  67mL GADAVIST GADOBUTROL 1 MMOL/ML IV SOLN COMPARISON:  Radiographs 02/04/2022 FINDINGS: Bones/Joint/Cartilage No significant marrow edema or enhancement indicate osteomyelitis. No bony destructive findings. Spurring at the first digit MTP joint with a small degenerative subcortical cyst along the plantar base of the proximal phalanx great toe. Spurring at the articulation of the first metatarsal in the sesamoids. Ligaments Lisfranc ligament intact. Muscles and Tendons Mild subcutaneous edema and enhancement along the flexor digitorum longus tendons. Soft tissues In the plantar subcutaneous tissues along the ball of the foot the level of the third digit there is abnormal edema signal surrounding a 5 by 5 mm T1 and T2 signal hypointensity suspicious for a small amount of subcutaneous gas. There is also small amount of cutaneous or subcutaneous gas further plantar on image 17 series 8. The appearance is compatible with soft tissue infection and/or puncture wound. This corresponds to the location of the suspected soft tissue gas on radiography. IMPRESSION: 1. Focal inflammation and a small amount soft tissue gas in the plantar subcutaneous tissues along the ball of the foot at the level of the third digit (about at the distal level of the proximal metaphysis of the third proximal phalanx on image 12 series 9). No large drainable abscess is observed. 2. Low-level  edema and enhancement tracking along the flexor digitorum longus tendon distally compatible with inflammation. No gas or drainable abscess tracks along the margins of the tendon. 3. Degenerative findings at the first MTP joint. Electronically Signed   By: Van Clines M.D.   On: 02/04/2022 16:04   DG Foot Complete Right  Result Date: 02/04/2022 CLINICAL DATA:  Pain and redness of the foot EXAM: RIGHT FOOT COMPLETE - 3+ VIEW COMPARISON:  None Available. FINDINGS: No acute fracture or dislocation. No aggressive osseous lesion. Normal alignment. Mild osteoarthritis of the first MTP joint. Soft tissue edema along the plantar aspect of  the right forefoot with a small amount air concerning for cellulitis with air which may be secondary to the puncture wound versus necrotizing infection. No radiopaque foreign body or soft tissue emphysema. Peripheral vascular atherosclerotic disease. IMPRESSION: 1.  No acute osseous injury of the right foot. 2. Soft tissue edema along the plantar aspect of the right forefoot with a small amount air concerning for cellulitis with air which may be secondary to the puncture wound versus necrotizing infection. Electronically Signed   By: Elige Ko M.D.   On: 02/04/2022 13:44    Review of Systems  Constitutional:  Negative for chills and fever.  HENT:  Negative for sinus pain and sore throat.   Respiratory:  Negative for cough and shortness of breath.   Cardiovascular:  Negative for chest pain and palpitations.  Gastrointestinal:  Negative for nausea and vomiting.  Endocrine: Negative for polydipsia and polyuria.  Genitourinary:  Negative for frequency and urgency.  Musculoskeletal:        Patient relates significant pain with his right foot secondary to recent puncture wound from a nail.  Skin:        Patient relates recent puncture wound to the right foot with increasing redness and swelling in the right foot.  States the redness and swelling has gone down some since  antibiotics have been initiated in the emergency department.  Neurological:        Patient does relate some numbness and paresthesias in the feet related to his diabetes  Psychiatric/Behavioral:  Negative for confusion. The patient is not nervous/anxious.    Blood pressure (!) 160/80, pulse 78, temperature 98.2 F (36.8 C), temperature source Oral, resp. rate 18, height 5\' 9"  (1.753 m), weight 83.9 kg, SpO2 98 %. Physical Exam Cardiovascular:     Comments: DP and PT pulses are fully palpable. Musculoskeletal:     Comments: Guarded range of motion in the right foot.  Muscle testing deferred.  Pain on palpation in the plantar forefoot.  Skin:    Comments: The skin is warm dry and supple.  There is some erythema and edema in the right foot, more significantly on the plantar aspect.  Puncture wound beneath the right third metatarsal area that has completely covered over with no active drainage at this point.  Neurological:     Comments: Protective threshold with a monofilament wire is intact and symmetric to the forefoot and all digits.  Proprioception is impaired.     Assessment/Plan: Assessment: 1.  Puncture wound right foot with cellulitis and abscess. 2.  Diabetes with some degree of neuropathy.  Plan: Discussed with the patient x-ray and MRI findings with a small abscessed area beneath the puncture wound site.  Does not appear to be any bone or joint involvement.  Discussed the need for incision and drainage of the puncture wound to try to clear out the infection in his foot.  Discussed possible risks and complications of the procedure including but not limited to inability of the wound to heal due to his diabetes or continued infection.  Discussed that he would need to be nonweightbearing with a plantar incision on his foot.  Questions invited and answered.  No guarantees can be given as to the outcome of surgery.  Patient elects to proceed with surgery and we will schedule this for tomorrow  afternoon.  Consent form for I&D puncture wound and abscess right foot.  N.p.o. after midnight.  Plan for surgery tomorrow afternoon.  02/04/2022, 6:01 PM

## 2022-02-04 NOTE — ED Provider Notes (Addendum)
[facility] Provider Note    Event Date/Time   First MD Initiated Contact with Patient 02/04/22 1306     (approximate)   History   Foot Pain   HPI  Ryan Pugh is a 66 y.o. male with history of diabetes presents emergency department complaint of right foot pain.  Patient was seen here in the ED for a puncture puncture wound where the nail went through his flip-flop.  States he was started on Cipro and has been taking it twice daily as instructed.  States the redness and swelling have increased.  Patient is concerned as he is having more pain.  No fever or chills.  Patient is on pills for his diabetes.  He does not take insulin      Physical Exam   Triage Vital Signs: ED Triage Vitals  Enc Vitals Group     BP 02/04/22 1208 (!) 167/82     Pulse Rate 02/04/22 1208 81     Resp 02/04/22 1208 18     Temp 02/04/22 1208 98.2 F (36.8 C)     Temp Source 02/04/22 1208 Oral     SpO2 02/04/22 1208 97 %     Weight 02/04/22 1209 185 lb (83.9 kg)     Height 02/04/22 1209 5\' 9"  (1.753 m)     Head Circumference --      Peak Flow --      Pain Score 02/04/22 1206 6     Pain Loc --      Pain Edu? --      Excl. in GC? --     Most recent vital signs: Vitals:   02/04/22 1208  BP: (!) 167/82  Pulse: 81  Resp: 18  Temp: 98.2 F (36.8 C)  SpO2: 97%     General: Awake, no distress.   CV:  Good peripheral perfusion. regular rate and  rhythm Resp:  Normal effort.  Abd:  No distention.   Other:  Right foot is warm tender and swollen, puncture wound noted to the plantar surface, area is very tender to palpation, no drainage   ED Results / Procedures / Treatments   Labs (all labs ordered are listed, but only abnormal results are displayed) Labs Reviewed  CBC WITH DIFFERENTIAL/PLATELET - Abnormal; Notable for the following components:      Result Value   Eosinophils Absolute 0.6 (*)    All other components within normal limits  BASIC METABOLIC PANEL -  Abnormal; Notable for the following components:   Glucose, Bld 107 (*)    All other components within normal limits     EKG     RADIOLOGY X-ray of the right foot    PROCEDURES:   Procedures   MEDICATIONS ORDERED IN ED: Medications  vancomycin (VANCOREADY) IVPB 2000 mg/400 mL (has no administration in time range)     IMPRESSION / MDM / ASSESSMENT AND PLAN / ED COURSE  I reviewed the triage vital signs and the nursing notes.                              Differential diagnosis includes, but is not limited to, cellulitis, wound infection, osteomyelitis, diabetic foot infection  Patient's presentation is most consistent with acute presentation with potential threat to life or bodily function.   Due to the patient being diabetic and having a foot infection while taking oral antibiotics will do labs and imaging.  We will consider  admission for the patient, I feel he failed outpatient therapy  Labs are reassuring, WBC is normal, glucose is normal.  X-ray of the right foot interpreted by me as no osteomyelitis but does show a gas pattern.  Concerns for air and infection  Consult to podiatry Consult hospitalist  Spoke with Dr. Alberteen Spindle.  He will be consulting on the patient.  Had asked for [place] to have hospitalist admit  Spoke with Dr. Joylene Igo, she will be admitting the patient.  Would like for [place] to asked Dr. Alberteen Spindle if he needs an MRI or CT to take a better picture of the foot.  Concerns of necrotizing fasciitis was noted on x-ray report by the radiologist.  Dr Alberteen Spindle would like MRI right foot  Patient aware of admission and testing.  He is in stable condition.   FINAL CLINICAL IMPRESSION(S) / ED DIAGNOSES   Final diagnoses:  Diabetic foot infection (HCC)  Puncture wound     Rx / DC Orders   ED Discharge Orders     None        Note:  This document was prepared using Dragon voice recognition software and may include unintentional dictation errors.    Faythe Ghee, PA-C 02/04/22 1410    Sherrie Mustache Roselyn Bering, PA-C 02/04/22 1410    Merwyn Katos, MD 02/04/22 [phone]

## 2022-02-04 NOTE — Assessment & Plan Note (Addendum)
Infection developed following a puncture wound. Patient has increased risk factors for both Pseudomonas and MRSA.  MRI of the right foot on 7/10 was negative for signs of osteomyelitis. -- Treated with empiric vancomycin and cefepime (initially Flagyl as well) --discharging on oral clindamycin and Cipro to complete 14 days --Podiatry to follow-up final cultures -- Status post I&D in the OR on 7/11 --Wound care per podiatry's instructions -- Nonweightbearing on the right foot

## 2022-02-05 ENCOUNTER — Inpatient Hospital Stay: Payer: Managed Care, Other (non HMO) | Admitting: Anesthesiology

## 2022-02-05 ENCOUNTER — Other Ambulatory Visit: Payer: Self-pay

## 2022-02-05 ENCOUNTER — Encounter: Payer: Self-pay | Admitting: Internal Medicine

## 2022-02-05 ENCOUNTER — Encounter
Admission: EM | Disposition: A | Discharge status: Home / Self Care | Payer: Self-pay | Source: Home / Self Care | Attending: Internal Medicine

## 2022-02-05 DIAGNOSIS — E11628 Type 2 diabetes mellitus with other skin complications: Secondary | ICD-10-CM | POA: Diagnosis not present

## 2022-02-05 DIAGNOSIS — E119 Type 2 diabetes mellitus without complications: Secondary | ICD-10-CM

## 2022-02-05 DIAGNOSIS — E785 Hyperlipidemia, unspecified: Secondary | ICD-10-CM

## 2022-02-05 DIAGNOSIS — E1122 Type 2 diabetes mellitus with diabetic chronic kidney disease: Secondary | ICD-10-CM | POA: Diagnosis not present

## 2022-02-05 DIAGNOSIS — N1831 Chronic kidney disease, stage 3a: Secondary | ICD-10-CM

## 2022-02-05 DIAGNOSIS — T148XXA Other injury of unspecified body region, initial encounter: Secondary | ICD-10-CM

## 2022-02-05 HISTORY — PX: IRRIGATION AND DEBRIDEMENT FOOT: SHX6602

## 2022-02-05 LAB — CBC
HCT: 36.5 % — ABNORMAL LOW (ref 39.0–52.0)
Hemoglobin: 12.3 g/dL — ABNORMAL LOW (ref 13.0–17.0)
MCH: 29.5 pg (ref 26.0–34.0)
MCHC: 33.7 g/dL (ref 30.0–36.0)
MCV: 87.5 fL (ref 80.0–100.0)
Platelets: 256 10*3/uL (ref 150–400)
RBC: 4.17 MIL/uL — ABNORMAL LOW (ref 4.22–5.81)
RDW: 12.5 % (ref 11.5–15.5)
WBC: 6.9 10*3/uL (ref 4.0–10.5)
nRBC: 0 % (ref 0.0–0.2)

## 2022-02-05 LAB — GLUCOSE, CAPILLARY
Glucose-Capillary: 118 mg/dL — ABNORMAL HIGH (ref 70–99)
Glucose-Capillary: 142 mg/dL — ABNORMAL HIGH (ref 70–99)
Glucose-Capillary: 143 mg/dL — ABNORMAL HIGH (ref 70–99)
Glucose-Capillary: 305 mg/dL — ABNORMAL HIGH (ref 70–99)
Glucose-Capillary: 280 mg/dL — ABNORMAL HIGH (ref 70–99)

## 2022-02-05 LAB — BASIC METABOLIC PANEL
Anion gap: 5 (ref 5–15)
BUN: 17 mg/dL (ref 8–23)
CO2: 25 mmol/L (ref 22–32)
Calcium: 8.3 mg/dL — ABNORMAL LOW (ref 8.9–10.3)
Chloride: 108 mmol/L (ref 98–111)
Creatinine, Ser: 1.15 mg/dL (ref 0.61–1.24)
GFR, Estimated: >60 mL/min (ref >60)
Glucose, Bld: 109 mg/dL — ABNORMAL HIGH (ref 70–99)
Potassium: 4 mmol/L (ref 3.5–5.1)
Sodium: 138 mmol/L (ref 135–145)

## 2022-02-05 LAB — HEMOGLOBIN A1C
Hgb A1c MFr Bld: 8.2 % — ABNORMAL HIGH (ref 4.8–5.6)
Mean Plasma Glucose: 188.64 mg/dL

## 2022-02-05 LAB — C-REACTIVE PROTEIN: CRP: 2.3 mg/dL — ABNORMAL HIGH (ref <1.0)

## 2022-02-05 LAB — PREALBUMIN: Prealbumin: 20.6 mg/dL (ref 18–38)

## 2022-02-05 SURGERY — IRRIGATION AND DEBRIDEMENT FOOT
Anesthesia: General | Site: Foot | Laterality: Right

## 2022-02-05 MED ORDER — DEXAMETHASONE SODIUM PHOSPHATE 10 MG/ML IJ SOLN
INTRAMUSCULAR | Status: AC
Start: 2022-02-05 — End: ?
  Filled 2022-02-05: qty 1

## 2022-02-05 MED ORDER — ADULT MULTIVITAMIN W/MINERALS CH
1.0000 | ORAL_TABLET | Freq: Every day | ORAL | Status: DC
  Administered 2022-02-06 – 2022-02-08 (×3): 1 via ORAL
  Filled 2022-02-05 (×3): qty 1

## 2022-02-05 MED ORDER — ONDANSETRON HCL 4 MG/2ML IJ SOLN: 4.0000 mg | Freq: Once | INTRAMUSCULAR | Status: DC | PRN

## 2022-02-05 MED ORDER — LIDOCAINE HCL (PF) 2 % IJ SOLN
INTRAMUSCULAR | Status: AC
  Filled 2022-02-05: qty 5

## 2022-02-05 MED ORDER — MIDAZOLAM HCL 2 MG/2ML IJ SOLN
INTRAMUSCULAR | Status: DC | PRN
  Administered 2022-02-05: 2 mg via INTRAVENOUS

## 2022-02-05 MED ORDER — ONDANSETRON HCL 4 MG/2ML IJ SOLN
INTRAMUSCULAR | Status: AC
  Filled 2022-02-05: qty 2

## 2022-02-05 MED ORDER — FENTANYL CITRATE (PF) 100 MCG/2ML IJ SOLN
INTRAMUSCULAR | Status: AC
  Filled 2022-02-05: qty 2

## 2022-02-05 MED ORDER — KETOROLAC TROMETHAMINE 15 MG/ML IJ SOLN
15.0000 mg | Freq: Once | INTRAMUSCULAR | Status: AC
  Administered 2022-02-05: 15 mg via INTRAVENOUS
  Filled 2022-02-05: qty 1

## 2022-02-05 MED ORDER — DEXAMETHASONE SODIUM PHOSPHATE 10 MG/ML IJ SOLN
INTRAMUSCULAR | Status: DC | PRN
  Administered 2022-02-05: 5 mg via INTRAVENOUS

## 2022-02-05 MED ORDER — ONDANSETRON HCL 4 MG/2ML IJ SOLN
INTRAMUSCULAR | Status: DC | PRN
  Administered 2022-02-05: 4 mg via INTRAVENOUS

## 2022-02-05 MED ORDER — LACTATED RINGERS IV SOLN: INTRAVENOUS | Status: DC | PRN

## 2022-02-05 MED ORDER — PHENYLEPHRINE 80 MCG/ML (10ML) SYRINGE FOR IV PUSH (FOR BLOOD PRESSURE SUPPORT)
PREFILLED_SYRINGE | INTRAVENOUS | Status: AC
Start: 2022-02-05 — End: ?
  Filled 2022-02-05: qty 10

## 2022-02-05 MED ORDER — MIDAZOLAM HCL 2 MG/2ML IJ SOLN
INTRAMUSCULAR | Status: AC
Start: 2022-02-05 — End: ?
  Filled 2022-02-05: qty 2

## 2022-02-05 MED ORDER — BUPIVACAINE HCL (PF) 0.5 % IJ SOLN
INTRAMUSCULAR | Status: AC
Start: 2022-02-05 — End: ?
  Filled 2022-02-05: qty 30

## 2022-02-05 MED ORDER — OXYCODONE-ACETAMINOPHEN 5-325 MG PO TABS
1.0000 | ORAL_TABLET | ORAL | Status: DC | PRN
  Administered 2022-02-05: 1 via ORAL
  Administered 2022-02-05 – 2022-02-06 (×2): 2 via ORAL
  Administered 2022-02-06: 1 via ORAL
  Administered 2022-02-07 (×4): 2 via ORAL
  Administered 2022-02-08: 1 via ORAL
  Administered 2022-02-08: 2 via ORAL
  Filled 2022-02-05 (×3): qty 2
  Filled 2022-02-05 (×2): qty 1
  Filled 2022-02-05 (×3): qty 2
  Filled 2022-02-05: qty 1
  Filled 2022-02-05: qty 2

## 2022-02-05 MED ORDER — 0.9 % SODIUM CHLORIDE (POUR BTL) OPTIME
TOPICAL | Status: DC | PRN
  Administered 2022-02-05: 500 mL

## 2022-02-05 MED ORDER — KETOROLAC TROMETHAMINE 15 MG/ML IJ SOLN
15.0000 mg | Freq: Four times a day (QID) | INTRAMUSCULAR | Status: AC | PRN
  Administered 2022-02-05: 15 mg via INTRAVENOUS
  Filled 2022-02-05: qty 1

## 2022-02-05 MED ORDER — ACETAMINOPHEN 325 MG PO TABS: 650.0000 mg | ORAL_TABLET | ORAL | Status: DC | PRN

## 2022-02-05 MED ORDER — SUCCINYLCHOLINE CHLORIDE 200 MG/10ML IV SOSY
PREFILLED_SYRINGE | INTRAVENOUS | Status: AC
  Filled 2022-02-05: qty 10

## 2022-02-05 MED ORDER — BUPIVACAINE HCL (PF) 0.5 % IJ SOLN
INTRAMUSCULAR | Status: DC | PRN
  Administered 2022-02-05: 8 mL

## 2022-02-05 MED ORDER — LIDOCAINE HCL (CARDIAC) PF 100 MG/5ML IV SOSY
PREFILLED_SYRINGE | INTRAVENOUS | Status: DC | PRN
  Administered 2022-02-05: 60 mg via INTRAVENOUS

## 2022-02-05 MED ORDER — PROPOFOL 10 MG/ML IV BOLUS
INTRAVENOUS | Status: DC | PRN
  Administered 2022-02-05: 170 mg via INTRAVENOUS

## 2022-02-05 MED ORDER — MORPHINE SULFATE (PF) 2 MG/ML IV SOLN
2.0000 mg | INTRAVENOUS | Status: DC | PRN
  Administered 2022-02-05 – 2022-02-07 (×2): 2 mg via INTRAVENOUS
  Filled 2022-02-05 (×2): qty 1

## 2022-02-05 MED ORDER — FENTANYL CITRATE (PF) 100 MCG/2ML IJ SOLN: 25.0000 ug | INTRAMUSCULAR | Status: DC | PRN

## 2022-02-05 MED ORDER — ASCORBIC ACID 500 MG PO TABS
500.0000 mg | ORAL_TABLET | Freq: Two times a day (BID) | ORAL | Status: DC
  Administered 2022-02-05 – 2022-02-08 (×6): 500 mg via ORAL
  Filled 2022-02-05 (×6): qty 1

## 2022-02-05 MED ORDER — ENSURE MAX PROTEIN PO LIQD
11.0000 [oz_av] | Freq: Two times a day (BID) | ORAL | Status: DC
  Administered 2022-02-05 – 2022-02-08 (×6): 11 [oz_av] via ORAL
  Filled 2022-02-05: qty 330

## 2022-02-05 MED ORDER — FENTANYL CITRATE (PF) 100 MCG/2ML IJ SOLN
INTRAMUSCULAR | Status: DC | PRN
Start: 2022-02-05 — End: 2022-02-05
  Administered 2022-02-05 (×4): 25 ug via INTRAVENOUS

## 2022-02-05 MED ORDER — ZINC SULFATE 220 (50 ZN) MG PO CAPS
220.0000 mg | ORAL_CAPSULE | Freq: Every day | ORAL | Status: DC
Start: 2022-02-06 — End: 2022-02-08
  Administered 2022-02-06 – 2022-02-08 (×3): 220 mg via ORAL
  Filled 2022-02-05 (×3): qty 1

## 2022-02-05 MED ORDER — SODIUM CHLORIDE 0.9 % IV SOLN: INTRAVENOUS | Status: DC | PRN

## 2022-02-05 SURGICAL SUPPLY — 53 items
"PENCIL ELECTRO HAND CTR " (MISCELLANEOUS) ×1 IMPLANT
BLADE OSCILLATING/SAGITTAL (BLADE)
BLADE SURG 15 STRL LF DISP TIS (BLADE) ×1 IMPLANT
BLADE SURG 15 STRL SS (BLADE) ×1
BLADE SURG MINI STRL (BLADE) IMPLANT
BLADE SW THK.38XMED LNG THN (BLADE) IMPLANT
BNDG CONFORM 6X.82 1P STRL (GAUZE/BANDAGES/DRESSINGS) ×1 IMPLANT
BNDG ELASTIC 3X5.8 VLCR STR LF (GAUZE/BANDAGES/DRESSINGS) ×1 IMPLANT
BNDG ELASTIC 4X5.8 VLCR NS LF (GAUZE/BANDAGES/DRESSINGS) ×2 IMPLANT
BNDG ESMARK 4X12 TAN STRL LF (GAUZE/BANDAGES/DRESSINGS) ×2 IMPLANT
BNDG GAUZE DERMACEA FLUFF (GAUZE/BANDAGES/DRESSINGS) ×1
BNDG GAUZE DERMACEA FLUFF 4 (GAUZE/BANDAGES/DRESSINGS) ×1 IMPLANT
CUFF TOURN SGL QUICK 12 (TOURNIQUET CUFF) IMPLANT
CUFF TOURN SGL QUICK 18X4 (TOURNIQUET CUFF) IMPLANT
DRAPE FLUOR MINI C-ARM 54X84 (DRAPES) IMPLANT
DURAPREP 26ML APPLICATOR (WOUND CARE) ×2 IMPLANT
ELECT REM PT RETURN 9FT ADLT (ELECTROSURGICAL) ×2
ELECTRODE REM PT RTRN 9FT ADLT (ELECTROSURGICAL) ×1 IMPLANT
GAUZE SPONGE 4X4 12PLY STRL (GAUZE/BANDAGES/DRESSINGS) ×2 IMPLANT
GAUZE STRETCH 2X75IN STRL (MISCELLANEOUS) ×2 IMPLANT
GAUZE XEROFORM 1X8 LF (GAUZE/BANDAGES/DRESSINGS) ×2 IMPLANT
GLOVE BIO SURGEON STRL SZ7.5 (GLOVE) ×2 IMPLANT
GLOVE SURG UNDER LTX SZ8 (GLOVE) ×2 IMPLANT
GOWN STRL REUS W/ TWL LRG LVL3 (GOWN DISPOSABLE) ×2 IMPLANT
GOWN STRL REUS W/TWL LRG LVL3 (GOWN DISPOSABLE) ×2
HANDPIECE VERSAJET DEBRIDEMENT (MISCELLANEOUS) ×2 IMPLANT
KIT TURNOVER KIT A (KITS) ×2 IMPLANT
LABEL OR SOLS (LABEL) ×2 IMPLANT
MANIFOLD NEPTUNE II (INSTRUMENTS) ×2 IMPLANT
NDL FILTER BLUNT 18X1 1/2 (NEEDLE) ×1 IMPLANT
NDL HYPO 25X1 1.5 SAFETY (NEEDLE) ×3 IMPLANT
NEEDLE FILTER BLUNT 18X 1/2SAF (NEEDLE) ×1
NEEDLE FILTER BLUNT 18X1 1/2 (NEEDLE) ×1 IMPLANT
NEEDLE HYPO 25X1 1.5 SAFETY (NEEDLE) ×6 IMPLANT
NS IRRIG 500ML POUR BTL (IV SOLUTION) ×2 IMPLANT
PACK EXTREMITY ARMC (MISCELLANEOUS) ×2 IMPLANT
PAD ABD DERMACEA PRESS 5X9 (GAUZE/BANDAGES/DRESSINGS) ×4 IMPLANT
PENCIL ELECTRO HAND CTR (MISCELLANEOUS) ×2 IMPLANT
RASP SM TEAR CROSS CUT (RASP) IMPLANT
SOL PREP PVP 2OZ (MISCELLANEOUS) ×2
SOLUTION PREP PVP 2OZ (MISCELLANEOUS) ×1 IMPLANT
STOCKINETTE STRL 6IN 960660 (GAUZE/BANDAGES/DRESSINGS) ×2 IMPLANT
SUT ETHILON 3-0 FS-10 30 BLK (SUTURE) ×2
SUT ETHILON 4-0 (SUTURE) ×1
SUT ETHILON 4-0 FS2 18XMFL BLK (SUTURE) ×1
SUT VIC AB 3-0 SH 27 (SUTURE) ×1
SUT VIC AB 3-0 SH 27X BRD (SUTURE) ×1 IMPLANT
SUT VIC AB 4-0 FS2 27 (SUTURE) ×2 IMPLANT
SUTURE EHLN 3-0 FS-10 30 BLK (SUTURE) ×1 IMPLANT
SUTURE ETHLN 4-0 FS2 18XMF BLK (SUTURE) ×1 IMPLANT
SYR 10ML LL (SYRINGE) ×4 IMPLANT
SYR 3ML LL SCALE MARK (SYRINGE) ×2 IMPLANT
WATER STERILE IRR 500ML POUR (IV SOLUTION) ×2 IMPLANT

## 2022-02-05 NOTE — Interval H&P Note (Signed)
History and Physical Interval Note:  02/05/2022 12:22 PM  Ryan Pugh  has presented today for surgery, with the diagnosis of Right foot puncture wound.  The various methods of treatment have been discussed with the patient and family. After consideration of risks, benefits and other options for treatment, the patient has consented to  Procedure(s): IRRIGATION AND DEBRIDEMENT Right Foot Wound (Right) as a surgical intervention.  The patient's history has been reviewed, patient examined, no change in status, stable for surgery.  I have reviewed the patient's chart and labs.  Questions were answered to the patient's satisfaction.     Ricci Barker

## 2022-02-05 NOTE — Interval H&P Note (Signed)
History and Physical Interval Note:  02/05/2022 12:22 PM  Ryan Pugh  has presented today for surgery, with the diagnosis of Right foot puncture wound.  The various methods of treatment have been discussed with the patient and family. After consideration of risks, benefits and other options for treatment, the patient has consented to  Procedure(s): IRRIGATION AND DEBRIDEMENT Right Foot Wound (Right) as a surgical intervention.  The patient's history has been reviewed, patient examined, no change in status, stable for surgery.  I have reviewed the patient's chart and labs.  Questions were answered to the patient's satisfaction.     Gabrille Kilbride W Imanii Gosdin   

## 2022-02-05 NOTE — Op Note (Signed)
Date of operation: 02/05/2022.  Surgeon: Ricci Barker D.P.M.  Preoperative diagnosis: Puncture wound with abscess right foot.  Postoperative diagnosis: Same.  Procedure: I&D puncture wound abscess right foot.  Anesthesia: LMA.  Hemostasis: Esmarch tourniquet right ankle.  Estimated blood loss: Less than 5 cc.  Injectables: 9 cc 0.5% Marcaine plain.  Cultures: Deep wound cultures right foot abscess.  Drains: 4 x 4 saline gauze packing to the wound.  Complications: None apparent.  Operative indications: This is a 66 year old diabetic male who stepped on a nail approximately 1 week ago.  Presented to the emergency department and was placed on antibiotics but had continued progression of his infection and represented to the emergency department and was admitted for I&D and IV antibiotics.  Operative procedure: Patient was taken to the operating room and placed on the table in the supine position.  Following satisfactory LMA anesthesia the right foot was prepped and draped in the usual sterile fashion.  The foot was exsanguinated using an Esmarch which was then left intact around the ankle to act as a tourniquet attention was directed to the plantar aspect of the right foot where a linear incision was made through the puncture wound site extending both proximal and distal.  The incision was deepened via sharp and blunt dissection where abscess was noted with some underlying central necrotic tissue.  Culture taken for sensitivities.  Gross excisional debridement of the devitalized tissue using a rongeur down to the level of the deeper fascia.  The wound was then thoroughly debrided using a Versajet debrider on a setting of 4.  Wound was thoroughly irrigated using sterile saline under bulb syringe.  Underlying tissues appeared healthy at this point.  The incision was then coapted using 4-0 nylon simple interrupted sutures with a small opening in the distal portion of the incision to allow for  drainage.  4 x 4 saline gauze packed within the wound followed by Xeroform, 4 x 4's, conformer.  Tourniquet was released and blood flow noted return immediately to the right foot and all digits.  ABD Kerlix and Ace wrap then applied to the right lower extremity.  Patient was awakened and transported the PACU having tolerated the anesthesia and procedure well.

## 2022-02-05 NOTE — Progress Notes (Signed)
Initial Nutrition Assessment  DOCUMENTATION CODES:   Not applicable  INTERVENTION:   Ensure Max protein supplement BID, each supplement provides 150kcal and 30g of protein.  MVI po daily   Vitamin C 528m po BID  Zinc 2248mpo daily x 14 days  NUTRITION DIAGNOSIS:   Increased nutrient needs related to wound healing as evidenced by estimated needs.  GOAL:   Patient will meet greater than or equal to 90% of their needs  MONITOR:   PO intake, Supplement acceptance, Labs, Weight trends, Skin, I & O's  REASON FOR ASSESSMENT:   Consult Wound healing  ASSESSMENT:   89/o male with h/o DM, CKD III, HLD and BPH who is admitted with diabetic foot ulcer s/p I & D 7/11.  Met with pt in room today. Pt reports good appetite and oral intake at baseline. Pt reports that he is a picky eater. Pt eats mainly meats and carbohydrates and reports that he does not eat too much fruit and vegetables. RD discussed with pt the importance of adequate nutrition needed to support wound healing. Pt is willing to drink chocolate protein shakes in hospital. RD will add supplements and vitamins to help pt meet his estimated needs and to support wound healing. Recommended for pt to continue vitamins until his wound healing is complete. RD also discussed diabetic diet and the importance of good glycemic control needed for healing and to prevent infection. Per chart, pt is weight stable at baseline.   Medications reviewed and include: insulin  Labs reviewed: K 4.0 wnl Cbgs- 143, 142, 118 x 24 hrs AIC 8.2(H)- 7/10  NUTRITION - FOCUSED PHYSICAL EXAM:  Flowsheet Row Most Recent Value  Orbital Region No depletion  Upper Arm Region Moderate depletion  Thoracic and Lumbar Region No depletion  Buccal Region No depletion  Temple Region No depletion  Clavicle Bone Region Mild depletion  Clavicle and Acromion Bone Region Mild depletion  Scapular Bone Region No depletion  Dorsal Hand No depletion  Patellar  Region Moderate depletion  Anterior Thigh Region Mild depletion  Posterior Calf Region Mild depletion  Edema (RD Assessment) None  [place] Reviewed  Eyes Reviewed  Mouth Reviewed  Skin Reviewed  Nails Reviewed   Diet Order:   Diet Order             Diet Carb Modified Fluid consistency: Thin; Room service appropriate? Yes  Diet effective now                  EDUCATION NEEDS:   Education needs have been addressed  Skin:  Skin Assessment: Reviewed RN Assessment (incision R foot)  Last BM:  7/10  Height:   Ht Readings from Last 1 Encounters:  02/05/22 _0  (1.753 m)    Weight:   Wt Readings from Last 1 Encounters:  02/05/22 83.9 kg    Ideal Body Weight:  72.7 kg  BMI:  Body mass index is 27.31 kg/m.  Estimated Nutritional Needs:   Kcal:  1900-2200kcal/day  Protein:  90-110g/day  Fluid:  2.2-2.5L/day  CaKoleen DistanceS, RD, LDN Please refer to [facility]or RD and/or RD on-call/weekend/after hours pager

## 2022-02-05 NOTE — Inpatient Diabetes Management (Signed)
Inpatient Diabetes Program Recommendations  AACE/ADA: New Consensus Statement on Inpatient Glycemic Control   Target Ranges:  Prepandial:   less than 140 mg/dL      Peak postprandial:   less than 180 mg/dL (1-2 hours)      Critically ill patients:  140 - 180 mg/dL    Latest Reference Range & Units 02/04/22 16:42 02/05/22 07:37  Glucose-Capillary 70 - 99 mg/dL 098 (H) 119 (H)    Latest Reference Range & Units 02/04/22 20:03  Hemoglobin A1C 4.8 - 5.6 % 8.2 (H)   Review of Glycemic Control  Diabetes history: DM2 Outpatient Diabetes medications: Amaryl 4 mg BID, Januvia 100 mg daily, Metformin 1000 mg BID, Lantus 10 units daily as needed for high glucose Current orders for Inpatient glycemic control: Novolog 0-15 units TID with meals  Inpatient Diabetes Program Recommendations:    Insulin: Agree with current insulin orders.  HbgA1C: A1C 8.2% on 02/04/22 indicating an average glucose of 189 mg/dl over the past 2-3 months.  NOTE: Noted consult for diabetes coordinator. Chart reviewed. Patient admitted on 02/04/22 with foot infection. Per chart, patient stepped on a nail on 01/29/22 and came to ED on 01/30/22 and per H&P on 02/04/22,  "He was seen in the ER and received a dose of tetanus toxoid.  Patient was discharged home on ciprofloxacin which he has taken as prescribed."  Noted patient to have I&D of foot wound today. Spoke with patient over the phone regarding DM control. Patient states that he is taking Amaryl 4 mg BID, Januvia 100 mg daily, Metformin 1000 mg BID, and Lantus 10 units daily if needed for high sugars.  Asked patient to clarify "high sugars" and patient reports that he takes the Lantus if glucose is over 160-180 mg/dl. Patient states he has not had to use it very often lately as his glucose has been doing ok. Discussed impact of stress, infection, sickness on glucose control and stressed importance of good DM control to decrease risk of complications. Patient reports that his last A1C  was 7.2%. Discussed current A1C of 8.2% indicating an average glucose of 189 mg/dl over the past 2-3 months. Discussed glucose and A1C goals. Discussed current glucose trends and explained that if he receives any steroids (such as Decadron) during surgery, glucose may increase. Patient verbalized understanding of information and has no questions at this time.  Thanks, Orlando Penner, RN, MSN, CDCES Diabetes Coordinator Inpatient Diabetes Program [phone] (Team Pager from 8am to 5pm)

## 2022-02-05 NOTE — Anesthesia Preprocedure Evaluation (Signed)
Anesthesia Evaluation  Patient identified by MRN, date of birth, ID band Patient awake    Reviewed: Allergy & Precautions, H&P , NPO status , Patient's Chart, lab work & pertinent test results, reviewed documented beta blocker date and time   Airway Mallampati: II  TM Distance: >3 FB Neck ROM: full    Dental  (+) Teeth Intact   Pulmonary neg pulmonary ROS,    Pulmonary exam normal        Cardiovascular Exercise Tolerance: Good negative cardio ROS Normal cardiovascular exam Rate:Normal     Neuro/Psych negative neurological ROS  negative psych ROS   GI/Hepatic negative GI ROS, Neg liver ROS,   Endo/Other  negative endocrine ROSdiabetes, Poorly Controlled, Type 1, Insulin Dependent  Renal/GU Renal disease  negative genitourinary   Musculoskeletal   Abdominal   Peds  Hematology negative hematology ROS (+)   Anesthesia Other Findings   Reproductive/Obstetrics negative OB ROS                             Anesthesia Physical Anesthesia Plan  ASA: 3  Anesthesia Plan: General LMA   Post-op Pain Management:    Induction:   PONV Risk Score and Plan:   Airway Management Planned:   Additional Equipment:   Intra-op Plan:   Post-operative Plan:   Informed Consent: I have reviewed the patients History and Physical, chart, labs and discussed the procedure including the risks, benefits and alternatives for the proposed anesthesia with the patient or authorized representative who has indicated his/her understanding and acceptance.       Plan Discussed with: CRNA  Anesthesia Plan Comments:         Anesthesia Quick Evaluation

## 2022-02-05 NOTE — Progress Notes (Addendum)
PROGRESS NOTE    Ryan Pugh  ONG:[id number] DOB: Aug 25, 1955 DOA: 02/04/2022 PCP: Audery Amel, MD    Brief Narrative:   Ryan Pugh is a 66 y.o. male with medical history significant for diabetes mellitus, dyslipidemia, BPH presented to hospital with severe right foot pain with swelling.  He did have a puncture wound and was seen in the ED 01/30/2022 after stepping on a old nail.  He was discharged home on oral Cipro but foot continued to have swelling redness and pain so he decided to come to the hospital.  In the ED right foot x-ray was done without any osseous abnormality.  Patient was given IV antibiotics and was admitted to hospital for puncture wound with cellulitis in a diabetic patient.   During hospitalization, general surgery was consulted and plan for I&D 02/05/2022.   Assessment and Plan: Principal Problem:   Diabetic foot infection (HCC) Active Problems:   Diabetes mellitus without complication (HCC)   DM type 2 causing CKD stage 3 (HCC)   HLD (hyperlipidemia)   Puncture wound   * Diabetic foot infection secondary to puncture wound with surrounding cellulitis Surgery has been consulted and plan for I&D today.  MRI of the foot was performed due to history of diabetes which does not show osseous infection but the small amount of tissue and gas in edema.  Patient at increased risk for infection with Pseudomonas and MRSA. On vancomycin cefepime and metronidazole at this time.    Diabetes mellitus without complication (HCC) Continue diabetic diet ,sliding scale insulin, fingerstick glucose monitoring.  He is on Januvia and metformin and Lantus at home.  On long-acting and oral hypoglycemics on hold due to n.p.o. status.  CKD stage IIIa.  1.1.  We will continue to monitor  History of hyperlipidemia.  Not on medication.   DVT prophylaxis: SCDs Start: 02/04/22 1518   Code Status:     Code Status: Full Code  Disposition: Home likely in 1-2 days, follow surgical  recommendation  Status is: Inpatient  Remains inpatient appropriate because: Diabetic foot infection, puncture wound, need for debridement   Family Communication: None at bedside  Consultants:  Surgery  Procedures:  None yet  Antimicrobials:  Patient, cefepime, metronidazole  Anti-infectives (From admission, onward)    Start     Dose/Rate Route Frequency Ordered Stop   02/05/22 0400  [MAR Hold]  vancomycin (VANCOREADY) IVPB 750 mg/150 mL        (MAR Hold since Tue 02/05/2022 at 1140.Hold Reason: Transfer to a Procedural area)   750 mg 150 mL/hr over 60 Minutes Intravenous Every 12 hours 02/04/22 1557     02/04/22 1545  [MAR Hold]  ceFEPIme (MAXIPIME) 2 g in sodium chloride 0.9 % 100 mL IVPB        (MAR Hold since Tue 02/05/2022 at 1140.Hold Reason: Transfer to a Procedural area)   2 g 200 mL/hr over 30 Minutes Intravenous Every 8 hours 02/04/22 1532     02/04/22 1530  [MAR Hold]  metroNIDAZOLE (FLAGYL) IVPB 500 mg        (MAR Hold since Tue 02/05/2022 at 1140.Hold Reason: Transfer to a Procedural area)   500 mg 100 mL/hr over 60 Minutes Intravenous Every 12 hours 02/04/22 1520 02/11/22 1759   02/04/22 1400  vancomycin (VANCOREADY) IVPB 2000 mg/400 mL        2,000 mg 200 mL/hr over 120 Minutes Intravenous  Once 02/04/22 1352 02/04/22 1903       Subjective: Today, patient was seen  and examined at bedside.  Complains of mild pain over the right foot.  Denies any chills nausea vomiting fever.  Objective: Vitals:   02/04/22 2019 02/05/22 0409 02/05/22 0735 02/05/22 1144  BP: (!) 178/83 (!) 153/84 (!) 165/86 (!) 166/73  Pulse: 71 72 71 66  Resp: 18 18 18 16   Temp: 98.4 F (36.9 C) 97.8 F (36.6 C) 98.4 F (36.9 C) (!) 97 F (36.1 C)  TempSrc: Oral  Oral Tympanic  SpO2: 98% 98% 97% 97%  Weight:    83.9 kg  Height:    5\' 9"  (1.753 m)    Intake/Output Summary (Last 24 hours) at 02/05/2022 1300 Last data filed at 02/05/2022 0650 Gross per 24 hour  Intake 841.76 ml  Output  --  Net 841.76 ml   Filed Weights   02/04/22 1209 02/05/22 1144  Weight: 83.9 kg 83.9 kg    Physical Examination: Body mass index is 27.31 kg/m.   General:  Average built, not in obvious distress HENT:   No scleral pallor or icterus noted. Oral mucosa is moist.  Chest:  Clear breath sounds.  Diminished breath sounds bilaterally. No crackles or wheezes.  CVS: S1 &S2 heard. No murmur.  Regular rate and rhythm. Abdomen: Soft, nontender, nondistended.  Bowel sounds are heard.   Extremities:   Right foot plantar aspect swelling with erythema induration and a central dark puncture wound. Psych: Alert, awake and oriented, normal mood CNS:  No cranial nerve deficits.  Power equal in all extremities.   Skin: Warm and dry.  No rashes noted.  Data Reviewed:   CBC: Recent Labs  Lab 02/04/22 1330 02/05/22 0519  WBC 8.2 6.9  NEUTROABS 5.4  --   HGB 13.9 12.3*  HCT 41.3 36.5*  MCV 87.9 87.5  PLT 281 [id number]    Basic Metabolic Panel: Recent Labs  Lab 02/04/22 1330 02/05/22 0519  NA 136 138  K 4.2 4.0  CL 103 108  CO2 23 25  GLUCOSE 107* 109*  BUN 18 17  CREATININE 1.22 1.15  CALCIUM 9.3 8.3*    Liver Function Tests: No results for input(s): "AST", "ALT", "ALKPHOS", "BILITOT", "PROT", "ALBUMIN" in the last 168 hours.   Radiology Studies: MR FOOT RIGHT W WO CONTRAST  Result Date: 02/04/2022 CLINICAL DATA:  Right foot pain, diabetes. EXAM: MRI OF THE RIGHT FOREFOOT WITHOUT AND WITH CONTRAST TECHNIQUE: Multiplanar, multisequence MR imaging of the right forefoot was performed before and after the administration of intravenous contrast. CONTRAST:  78mL GADAVIST GADOBUTROL 1 MMOL/ML IV SOLN COMPARISON:  Radiographs 02/04/2022 FINDINGS: Bones/Joint/Cartilage No significant marrow edema or enhancement indicate osteomyelitis. No bony destructive findings. Spurring at the first digit MTP joint with a small degenerative subcortical cyst along the plantar base of the proximal phalanx great  toe. Spurring at the articulation of the first metatarsal in the sesamoids. Ligaments Lisfranc ligament intact. Muscles and Tendons Mild subcutaneous edema and enhancement along the flexor digitorum longus tendons. Soft tissues In the plantar subcutaneous tissues along the ball of the foot the level of the third digit there is abnormal edema signal surrounding a 5 by 5 mm T1 and T2 signal hypointensity suspicious for a small amount of subcutaneous gas. There is also small amount of cutaneous or subcutaneous gas further plantar on image 17 series 8. The appearance is compatible with soft tissue infection and/or puncture wound. This corresponds to the location of the suspected soft tissue gas on radiography. IMPRESSION: 1. Focal inflammation and a small amount soft tissue  gas in the plantar subcutaneous tissues along the ball of the foot at the level of the third digit (about at the distal level of the proximal metaphysis of the third proximal phalanx on image 12 series 9). No large drainable abscess is observed. 2. Low-level edema and enhancement tracking along the flexor digitorum longus tendon distally compatible with inflammation. No gas or drainable abscess tracks along the margins of the tendon. 3. Degenerative findings at the first MTP joint. Electronically Signed   By: Gaylyn Rong M.D.   On: 02/04/2022 16:04   DG Foot Complete Right  Result Date: 02/04/2022 CLINICAL DATA:  Pain and redness of the foot EXAM: RIGHT FOOT COMPLETE - 3+ VIEW COMPARISON:  None Available. FINDINGS: No acute fracture or dislocation. No aggressive osseous lesion. Normal alignment. Mild osteoarthritis of the first MTP joint. Soft tissue edema along the plantar aspect of the right forefoot with a small amount air concerning for cellulitis with air which may be secondary to the puncture wound versus necrotizing infection. No radiopaque foreign body or soft tissue emphysema. Peripheral vascular atherosclerotic disease.  IMPRESSION: 1.  No acute osseous injury of the right foot. 2. Soft tissue edema along the plantar aspect of the right forefoot with a small amount air concerning for cellulitis with air which may be secondary to the puncture wound versus necrotizing infection. Electronically Signed   By: Elige Ko M.D.   On: 02/04/2022 13:44      LOS: 1 day    Joycelyn Das, MD [facility] Available via Epic secure chat 7am-7pm After these hours, please refer to coverage provider listed on [email or web address] 02/05/2022, 1:00 PM

## 2022-02-05 NOTE — Transfer of Care (Signed)
Immediate Anesthesia Transfer of Care Note  Patient: Ryan Pugh  Procedure(s) Performed: IRRIGATION AND DEBRIDEMENT Right Foot Wound (Right: Foot)  Patient Location: PACU  Anesthesia Type:General  Level of Consciousness: awake, alert  and oriented  Airway & Oxygen Therapy: Patient Spontanous Breathing  Post-op Assessment: Report given to RN, Post -op Vital signs reviewed and stable and Patient moving all extremities X 4  Post vital signs: Reviewed and stable  Last Vitals:  Vitals Value Taken Time  BP 137/91 02/05/22 1318  Temp    Pulse 86 02/05/22 1320  Resp 17 02/05/22 1319  SpO2 95 % 02/05/22 1320  Vitals shown include unvalidated device data.  Last Pain:  Vitals:   02/05/22 1144  TempSrc: Tympanic  PainSc:       Patients Stated Pain Goal: 0 (02/05/22 0820)  Complications: No notable events documented.

## 2022-02-05 NOTE — Plan of Care (Signed)
Pt AAOx4, reports moderate pain in R foot. VS are stable. Plan for I&D this afternoon. Bed in lowest position,call light within reach. Will continue to monitor.

## 2022-02-05 NOTE — Anesthesia Procedure Notes (Signed)
Procedure Name: LMA Insertion Date/Time: 02/05/2022 12:38 PM  Performed by: Aundria Rud, CRNAPre-anesthesia Checklist: Patient identified, Patient being monitored, Timeout performed, Emergency Drugs available and Suction available Patient Re-evaluated:Patient Re-evaluated prior to induction Oxygen Delivery Method: Circle system utilized Preoxygenation: Pre-oxygenation with 100% oxygen Induction Type: IV induction Ventilation: Mask ventilation without difficulty LMA: LMA inserted LMA Size: 4.0 Tube type: Oral Number of attempts: 1 Placement Confirmation: positive ETCO2 and breath sounds checked- equal and bilateral Tube secured with: Tape Dental Injury: Teeth and Oropharynx as per pre-operative assessment

## 2022-02-06 ENCOUNTER — Encounter: Payer: Self-pay | Admitting: Podiatry

## 2022-02-06 DIAGNOSIS — L089 Local infection of the skin and subcutaneous tissue, unspecified: Secondary | ICD-10-CM | POA: Diagnosis not present

## 2022-02-06 DIAGNOSIS — E11628 Type 2 diabetes mellitus with other skin complications: Secondary | ICD-10-CM | POA: Diagnosis not present

## 2022-02-06 LAB — GLUCOSE, CAPILLARY
Glucose-Capillary: 187 mg/dL — ABNORMAL HIGH (ref 70–99)
Glucose-Capillary: 200 mg/dL — ABNORMAL HIGH (ref 70–99)
Glucose-Capillary: 207 mg/dL — ABNORMAL HIGH (ref 70–99)
Glucose-Capillary: 191 mg/dL — ABNORMAL HIGH (ref 70–99)

## 2022-02-06 MED ORDER — VANCOMYCIN HCL 1750 MG/350ML IV SOLN
1750.0000 mg | INTRAVENOUS | Status: DC
  Administered 2022-02-07: 1750 mg via INTRAVENOUS
  Filled 2022-02-06 (×2): qty 350

## 2022-02-06 MED ORDER — INSULIN ASPART 100 UNIT/ML IJ SOLN
3.0000 [IU] | Freq: Three times a day (TID) | INTRAMUSCULAR | Status: DC
  Administered 2022-02-06 – 2022-02-08 (×7): 3 [IU] via SUBCUTANEOUS
  Filled 2022-02-06 (×7): qty 1

## 2022-02-06 NOTE — Progress Notes (Signed)
  Progress Note   Patient: Ryan Pugh [id number]:[id number] DOB: 06-21-1956 DOA: 02/04/2022     2 DOS: the patient was seen and examined on 02/06/2022   Brief hospital course: Kahmari Koller is a 66 y.o. male with medical history significant for diabetes mellitus, dyslipidemia, BPH presented to hospital with severe right foot pain with swelling.  He did have a puncture wound and was seen in the ED 01/30/2022 after stepping on a old nail.  He was discharged home on oral Cipro but foot continued to have swelling redness and pain so he decided to come to the hospital.  In the ED right foot x-ray was done without any osseous abnormality.  Patient was given IV antibiotics and was admitted to hospital for puncture wound with cellulitis in a diabetic patient.    Podiatry was consulted and took patient to the OR for I&D on 02/05/2022.  Wound cultures sent.  Assessment and Plan: * Diabetic foot infection (HCC) Infection developed following a puncture wound. Patient has increased risk factors for both Pseudomonas and MRSA.  MRI of the right foot on 7/10 was negative for signs of osteomyelitis. --Continue empiric vancomycin and cefepime --Podiatry following -- Status post I&D in the OR on 7/11 -- Follow wound cultures and tailor antibiotics accordingly  Diabetes mellitus without complication (HCC) Hold oral hypoglycemic agent Sliding scale NovoLog Add 3 units NovoLog 3 times daily  Puncture wound As outlined  HLD (hyperlipidemia) Continue simvastatin  DM type 2 causing CKD stage 3 (HCC) On insulin as outlined. Creatinine within normal limits 1.15 -- Monitor BMP        Subjective: Patient awake sitting up in bed when seen on rounds today.  He reports pain control in the right foot is mostly adequate.  Denies other acute complaints including fevers chills, nausea vomiting diarrhea, chest pain shortness of breath or cough.  No acute events reported.  Physical Exam: Vitals:   02/05/22 1905 02/06/22 0417  02/06/22 0745 02/06/22 1525  BP: (!) 144/81 134/71 (!) 144/82 127/65  Pulse: 99 71 66 69  Resp: 16 18 16 16   Temp: 98.4 F (36.9 C) 98.1 F (36.7 C) 97.8 F (36.6 C) 97.9 F (36.6 C)  TempSrc:  Oral    SpO2: 92% 96% 96% 96%  Weight:      Height:       General exam: awake, alert, no acute distress HEENT: atraumatic, clear conjunctiva, anicteric sclera, moist mucus membranes, hearing grossly normal  Respiratory system:  normal respiratory effort.  On room air Cardiovascular system: normal S1/S2, RRR, no pedal edema.   Central nervous system: A&O x3. no gross focal neurologic deficits, normal speech Extremities: Clean dry intact dressing and Ace wrap on the right foot, no edema, normal tone Skin: dry, intact, normal temperature Psychiatry: normal mood, congruent affect, judgement and insight appear normal   Data Reviewed:  Notable labs: CBGs elevated as high as 305 yesterday at 1646, 280, 187, 200.  No new metabolic panel or CBC today.  Wound culture with rare WBCs, predominantly mononuclear, rare gram variable rods at less than 24 hours  Family Communication: None   Disposition: Status is: Inpatient Remains inpatient appropriate because: Remains on empiric broad-spectrum IV antibiotics pending culture data   Planned Discharge Destination: Home    Time spent: 40 minutes  Author: , DO 02/06/2022 4:21 PM  For on call review [email or web address].

## 2022-02-06 NOTE — Progress Notes (Signed)
1 Day Post-Op   Subjective/Chief Complaint: Patient seen.  States pain in the foot significantly improved as compared to yesterday.   Objective: Vital signs in last 24 hours: Temp:  [96.9 F (36.1 C)-98.4 F (36.9 C)] 97.8 F (36.6 C) (07/12 0745) Pulse Rate:  [65-99] 66 (07/12 0745) Resp:  [9-18] 16 (07/12 0745) BP: (134-165)/(71-91) 144/82 (07/12 0745) SpO2:  [92 %-97 %] 96 % (07/12 0745) Last BM Date : 02/04/22  Intake/Output from previous day: 07/11 0701 - 07/12 0700 In: 700 [P.O.:200; I.V.:500] Out: 1 [Blood:1] Intake/Output this shift: Total I/O In: 120 [P.O.:120] Out: -   Bandage on the right foot is dry and intact.  Upon removal there is some bleeding on the bandaging with minimal drainage.  No expressible purulence.  Cellulitis and edema significantly improving.   Lab Results:  Recent Labs    02/04/22 1330 02/05/22 0519  WBC 8.2 6.9  HGB 13.9 12.3*  HCT 41.3 36.5*  PLT 281 256   BMET Recent Labs    02/04/22 1330 02/05/22 0519  NA 136 138  K 4.2 4.0  CL 103 108  CO2 23 25  GLUCOSE 107* 109*  BUN 18 17  CREATININE 1.22 1.15  CALCIUM 9.3 8.3*   PT/INR No results for input(s): "LABPROT", "INR" in the last 72 hours. ABG No results for input(s): "PHART", "HCO3" in the last 72 hours.  Invalid input(s): "PCO2", "PO2"  Studies/Results: MR FOOT RIGHT W WO CONTRAST  Result Date: 02/04/2022 CLINICAL DATA:  Right foot pain, diabetes. EXAM: MRI OF THE RIGHT FOREFOOT WITHOUT AND WITH CONTRAST TECHNIQUE: Multiplanar, multisequence MR imaging of the right forefoot was performed before and after the administration of intravenous contrast. CONTRAST:  39mL GADAVIST GADOBUTROL 1 MMOL/ML IV SOLN COMPARISON:  Radiographs 02/04/2022 FINDINGS: Bones/Joint/Cartilage No significant marrow edema or enhancement indicate osteomyelitis. No bony destructive findings. Spurring at the first digit MTP joint with a small degenerative subcortical cyst along the plantar base of the  proximal phalanx great toe. Spurring at the articulation of the first metatarsal in the sesamoids. Ligaments Lisfranc ligament intact. Muscles and Tendons Mild subcutaneous edema and enhancement along the flexor digitorum longus tendons. Soft tissues In the plantar subcutaneous tissues along the ball of the foot the level of the third digit there is abnormal edema signal surrounding a 5 by 5 mm T1 and T2 signal hypointensity suspicious for a small amount of subcutaneous gas. There is also small amount of cutaneous or subcutaneous gas further plantar on image 17 series 8. The appearance is compatible with soft tissue infection and/or puncture wound. This corresponds to the location of the suspected soft tissue gas on radiography. IMPRESSION: 1. Focal inflammation and a small amount soft tissue gas in the plantar subcutaneous tissues along the ball of the foot at the level of the third digit (about at the distal level of the proximal metaphysis of the third proximal phalanx on image 12 series 9). No large drainable abscess is observed. 2. Low-level edema and enhancement tracking along the flexor digitorum longus tendon distally compatible with inflammation. No gas or drainable abscess tracks along the margins of the tendon. 3. Degenerative findings at the first MTP joint. Electronically Signed   By: Gaylyn Rong M.D.   On: 02/04/2022 16:04   DG Foot Complete Right  Result Date: 02/04/2022 CLINICAL DATA:  Pain and redness of the foot EXAM: RIGHT FOOT COMPLETE - 3+ VIEW COMPARISON:  None Available. FINDINGS: No acute fracture or dislocation. No aggressive osseous lesion. Normal  alignment. Mild osteoarthritis of the first MTP joint. Soft tissue edema along the plantar aspect of the right forefoot with a small amount air concerning for cellulitis with air which may be secondary to the puncture wound versus necrotizing infection. No radiopaque foreign body or soft tissue emphysema. Peripheral vascular  atherosclerotic disease. IMPRESSION: 1.  No acute osseous injury of the right foot. 2. Soft tissue edema along the plantar aspect of the right forefoot with a small amount air concerning for cellulitis with air which may be secondary to the puncture wound versus necrotizing infection. Electronically Signed   By: Elige Ko M.D.   On: 02/04/2022 13:44    Anti-infectives: Anti-infectives (From admission, onward)    Start     Dose/Rate Route Frequency Ordered Stop   02/07/22 1600  vancomycin (VANCOREADY) IVPB 1750 mg/350 mL        1,750 mg 175 mL/hr over 120 Minutes Intravenous Every 24 hours 02/06/22 1102     02/05/22 0400  vancomycin (VANCOREADY) IVPB 750 mg/150 mL  Status:  Discontinued        750 mg 150 mL/hr over 60 Minutes Intravenous Every 12 hours 02/04/22 1557 02/06/22 1102   02/04/22 1545  ceFEPIme (MAXIPIME) 2 g in sodium chloride 0.9 % 100 mL IVPB        2 g 200 mL/hr over 30 Minutes Intravenous Every 8 hours 02/04/22 1532     02/04/22 1530  metroNIDAZOLE (FLAGYL) IVPB 500 mg        500 mg 100 mL/hr over 60 Minutes Intravenous Every 12 hours 02/04/22 1520 02/11/22 1759   02/04/22 1400  vancomycin (VANCOREADY) IVPB 2000 mg/400 mL        2,000 mg 200 mL/hr over 120 Minutes Intravenous  Once 02/04/22 1352 02/04/22 1903       Assessment/Plan: s/p Procedure(s): IRRIGATION AND DEBRIDEMENT Right Foot Wound (Right) Assessment: Stable status post I&D right foot.  Plan: Packing removed.  Betadine wet-to-dry dressing applied to the foot.  Overall the foot appears significantly improved and stable and hopefully should be good for discharge but would like to wait a day or 2 so that hopefully culture results can return for antibiotic options on discharge.  LOS: 2 days    Ricci Barker 02/06/2022

## 2022-02-06 NOTE — Hospital Course (Signed)
Ryan Pugh is a 66 y.o. male with medical history significant for diabetes mellitus, dyslipidemia, BPH presented to hospital with severe right foot pain with swelling.  He did have a puncture wound and was seen in the ED 01/30/2022 after stepping on a old nail.  He was discharged home on oral Cipro but foot continued to have swelling redness and pain so he decided to come to the hospital.  In the ED right foot x-ray was done without any osseous abnormality.  Patient was given IV antibiotics and was admitted to hospital for puncture wound with cellulitis in a diabetic patient.    Podiatry was consulted and took patient to the OR for I&D on 02/05/2022.  Wound cultures sent.

## 2022-02-06 NOTE — TOC Initial Note (Signed)
Transition of Care [facility]) - Initial/Assessment Note    Patient Details  Name: Leonel Mccollum MRN: [id number] Date of Birth: 1956-04-25  Transition of Care [facility]) CM/SW Contact:    Chapman Fitch, RN Phone Number: 02/06/2022, 2:33 PM  Clinical Narrative:                  Transition of Care (TOC) Screening Note   Patient Details  Name: Luisfelipe Rodger Date of Birth: 1955/09/28   Transition of Care [facility]) CM/SW Contact:    Chapman Fitch, RN Phone Number: 02/06/2022, 2:33 PM    Transition of Care Department [facility]) has reviewed patient and no TOC needs have been identified at this time. We will continue to monitor patient advancement through interdisciplinary progression rounds. If new patient transition needs arise, please place a TOC consult.  - Patient has insurance coverage for medications. PCP [place]. Packing has been removed, and has a wet to dry dressing.  Patient will need to be educated for dressing changes prior to discharge. MD notified.  Patient would not be able to receive home health services due to not being home bound and his insurance provider.  Patient confirms he has crutches at home        Patient Goals and CMS Choice        Expected Discharge Plan and Services                                                Prior Living Arrangements/Services                       Activities of Daily Living Home Assistive Devices/Equipment: Eyeglasses ADL Screening (condition at time of admission) Patient's cognitive ability adequate to safely complete daily activities?: Yes Is the patient deaf or have difficulty hearing?: No Does the patient have difficulty seeing, even when wearing glasses/contacts?: No Does the patient have difficulty concentrating, remembering, or making decisions?: No Patient able to express need for assistance with ADLs?: Yes Does the patient have difficulty dressing or bathing?: No Independently performs ADLs?: Yes (appropriate  for developmental age) Communication: Independent Dressing (OT): Independent Grooming: Independent Feeding: Independent Bathing: Independent Toileting: Independent In/Out Bed: Independent Walks in Home: Independent Does the patient have difficulty walking or climbing stairs?: Yes Weakness of Legs: Right Weakness of Arms/Hands: None  Permission Sought/Granted                  Emotional Assessment              Admission diagnosis:  Puncture wound [T14.8XXA] Diabetic foot infection (HCC) [X38.182, L08.9] Patient Active Problem List   Diagnosis Date Noted   Puncture wound 02/05/2022   Diabetic foot infection (HCC) 02/04/2022   Diabetes mellitus without complication (HCC)    Complicated UTI (urinary tract infection) 07/12/2021   HLD (hyperlipidemia) 07/12/2021   Bladder calculus 07/12/2021   DM type 2 causing CKD stage 3 (HCC)    Acute renal failure superimposed on stage 3a chronic kidney disease (HCC)    Hydroureteronephrosis_left    Severe sepsis (HCC)    PCP:  Audery Amel, MD Pharmacy:   [id number] [place], [place] - [address] AT [facility] [address] [facility] [address] [place] [address] Phone: [phone] Fax: [phone]  Social Determinants of Health (SDOH) Interventions    Readmission Risk Interventions     No data to display           

## 2022-02-06 NOTE — Consult Note (Signed)
Pharmacy Antibiotic Note  Ryan Pugh is a 66 y.o. male admitted on 02/04/2022 with cellulitis/lower extremity wound.  Pharmacy has been consulted for vancomycin and cefepime dosing.  I&D completed 7/12. Abscess culture currently growing gram variable rods. Will continue empiric antibiotics currently(Day 3)  Plan: Continue metronidazole 500 mg IV Q12H Continue Cefepime 2 gram Q8H Will adjust Vancomycin to 1750 mg Q24H. Goal AUC 400-550 Estimated AUC 491.3/Cmin: 10.5 Scr 1.15, IBW, Vd 0.72   Height: 5\' 9"  (175.3 cm) Weight: 83.9 kg (184 lb 15.1 oz) IBW/kg (Calculated) : 70.7  Temp (24hrs), Avg:97.7 F (36.5 C), Min:96.9 F (36.1 C), Max:98.4 F (36.9 C)  Recent Labs  Lab 02/04/22 1330 02/05/22 0519  WBC 8.2 6.9  CREATININE 1.22 1.15     Estimated Creatinine Clearance: 65.7 mL/min (by C-G formula based on SCr of 1.15 mg/dL).    No Known Allergies  Antimicrobials this admission: 7/10 cefepime >>  7/10 Metronidazole >>  7/10 Vancomycin >>  Dose adjustments this admission: Vanc 750mg  q12h ro 1750mg  q24h  Microbiology results: 7/11: Wound cx: rare gram variable rods  Thank you for allowing pharmacy to be a part of this patient's care.  , PharmD Clinical Pharmacist 02/06/2022 11:06 AM

## 2022-02-06 NOTE — Assessment & Plan Note (Signed)
Continue simvastatin. 

## 2022-02-06 NOTE — Assessment & Plan Note (Signed)
On insulin as outlined. Creatinine within normal limits 1.15 -- Monitor BMP

## 2022-02-06 NOTE — Assessment & Plan Note (Signed)
As outlined  

## 2022-02-06 NOTE — Inpatient Diabetes Management (Signed)
Inpatient Diabetes Program Recommendations  AACE/ADA: New Consensus Statement on Inpatient Glycemic Control   Target Ranges:  Prepandial:   less than 140 mg/dL      Peak postprandial:   less than 180 mg/dL (1-2 hours)      Critically ill patients:  140 - 180 mg/dL    Latest Reference Range & Units 02/05/22 07:37 02/05/22 11:46 02/05/22 13:22 02/05/22 16:46 02/05/22 22:00 02/06/22 07:44  Glucose-Capillary 70 - 99 mg/dL 625 (H) 638 (H) 937 (H) 305 (H) 280 (H) 187 (H)   Review of Glycemic Control  Diabetes history: DM2 Outpatient Diabetes medications: Amaryl 4 mg BID, Januvia 100 mg daily, Metformin 1000 mg BID, Lantus 10 units daily as needed for high glucose Current orders for Inpatient glycemic control: Novolog 0-15 units TID with meals  Inpatient Diabetes Program Recommendations:    Insulin: Patient received Decadron 5 mg at 12:53 on 02/05/22 which is contributing to hyperglycemia. Please consider ordering Novolog 0-5 units QHS and Novolog 3 units TID with meals for meal coverage if patient eats at least 50% of meals.  Thanks, Orlando Penner, RN, MSN, CDCES Diabetes Coordinator Inpatient Diabetes Program [phone] (Team Pager from 8am to 5pm)

## 2022-02-07 DIAGNOSIS — L089 Local infection of the skin and subcutaneous tissue, unspecified: Secondary | ICD-10-CM | POA: Diagnosis not present

## 2022-02-07 DIAGNOSIS — E11628 Type 2 diabetes mellitus with other skin complications: Secondary | ICD-10-CM | POA: Diagnosis not present

## 2022-02-07 LAB — GLUCOSE, CAPILLARY
Glucose-Capillary: 125 mg/dL — ABNORMAL HIGH (ref 70–99)
Glucose-Capillary: 170 mg/dL — ABNORMAL HIGH (ref 70–99)
Glucose-Capillary: 165 mg/dL — ABNORMAL HIGH (ref 70–99)
Glucose-Capillary: 217 mg/dL — ABNORMAL HIGH (ref 70–99)

## 2022-02-07 MED ORDER — KETOROLAC TROMETHAMINE 15 MG/ML IJ SOLN
15.0000 mg | Freq: Four times a day (QID) | INTRAMUSCULAR | Status: DC | PRN
Start: 2022-02-07 — End: 2022-02-08

## 2022-02-07 MED ORDER — ORAL CARE MOUTH RINSE: 15.0000 mL | OROMUCOSAL | Status: DC | PRN

## 2022-02-07 MED ORDER — SENNOSIDES-DOCUSATE SODIUM 8.6-50 MG PO TABS
1.0000 | ORAL_TABLET | Freq: Two times a day (BID) | ORAL | Status: DC
  Administered 2022-02-07 – 2022-02-08 (×2): 1 via ORAL
  Filled 2022-02-07 (×2): qty 1

## 2022-02-07 MED ORDER — POLYETHYLENE GLYCOL 3350 17 G PO PACK
17.0000 g | PACK | Freq: Every day | ORAL | Status: DC
  Filled 2022-02-07: qty 1

## 2022-02-07 NOTE — Anesthesia Postprocedure Evaluation (Signed)
Anesthesia Post Note  Patient: Ryan Pugh  Procedure(s) Performed: IRRIGATION AND DEBRIDEMENT Right Foot Wound (Right: Foot)  Patient location during evaluation: PACU Anesthesia Type: General Level of consciousness: awake and alert Pain management: pain level controlled Vital Signs Assessment: post-procedure vital signs reviewed and stable Respiratory status: spontaneous breathing, nonlabored ventilation, respiratory function stable and patient connected to nasal cannula oxygen Cardiovascular status: blood pressure returned to baseline and stable Postop Assessment: no apparent nausea or vomiting Anesthetic complications: no   No notable events documented.   Last Vitals:  Vitals:   02/07/22 0818 02/07/22 1522  BP: (!) 144/80 140/70  Pulse: 66 69  Resp: 16 16  Temp: 36.4 C 36.8 C  SpO2: 97% 95%    Last Pain:  Vitals:   02/07/22 1117  TempSrc:   PainSc: Asleep                 Yevette Edwards

## 2022-02-07 NOTE — Progress Notes (Signed)
  Progress Note   Patient: Ryan Pugh ZOX:[id number] DOB: 06/19/1956 DOA: 02/04/2022     3 DOS: the patient was seen and examined on 02/07/2022   Brief hospital course: Jerrit Horen is a 66 y.o. male with medical history significant for diabetes mellitus, dyslipidemia, BPH presented to hospital with severe right foot pain with swelling.  He did have a puncture wound and was seen in the ED 01/30/2022 after stepping on a old nail.  He was discharged home on oral Cipro but foot continued to have swelling redness and pain so he decided to come to the hospital.  In the ED right foot x-ray was done without any osseous abnormality.  Patient was given IV antibiotics and was admitted to hospital for puncture wound with cellulitis in a diabetic patient.    Podiatry was consulted and took patient to the OR for I&D on 02/05/2022.  Wound cultures sent.  Assessment and Plan: * Diabetic foot infection (HCC) Infection developed following a puncture wound. Patient has increased risk factors for both Pseudomonas and MRSA.  MRI of the right foot on 7/10 was negative for signs of osteomyelitis. --Continue empiric vancomycin and cefepime until cultures result -- Stop Flagyl --Podiatry following -- Status post I&D in the OR on 7/11 -- Follow wound cultures and tailor antibiotics accordingly --Wound care per podiatry's instructions  Diabetes mellitus without complication (HCC) Hold oral hypoglycemic agent Sliding scale NovoLog Add 3 units NovoLog 3 times daily  Puncture wound As outlined  HLD (hyperlipidemia) Continue simvastatin  DM type 2 causing CKD stage 3 (HCC) On insulin as outlined. Creatinine within normal limits 1.15 -- Monitor BMP        Subjective: Patient awake sitting up in bed when seen on rounds today.  Reports increased pain overnight and didn't get very  much sleep until after 4 AM.  Feels tired today.  Hopes to get to go home tomorrow.  Discussed culture results still pending.    Physical Exam: Vitals:   02/06/22 1949 02/07/22 0340 02/07/22 0818 02/07/22 1522  BP: 123/74 (!) 143/84 (!) 144/80 140/70  Pulse: 73 63 66 69  Resp: 16 18 16 16   Temp: 98 F (36.7 C) 98.2 F (36.8 C) 97.6 F (36.4 C) 98.2 F (36.8 C)  TempSrc:  Oral    SpO2: 97% 98% 97% 95%  Weight:      Height:       General exam: awake, alert, no acute distress Respiratory system:  normal respiratory effort.  On room air Cardiovascular system: normal S1/S2, RRR, no pedal edema.   Central nervous system: A&O x3. no gross focal neurologic deficits, normal speech Extremities: Clean dry intact dressing and Ace wrap on the right foot, no edema, normal tone Skin: dry, intact, normal temperature Psychiatry: normal mood, congruent affect, judgement and insight appear normal   Data Reviewed:  Notable labs: CBG's at goal today.  Otherwise no new labs today.  Family Communication: None   Disposition: Status is: Inpatient Remains inpatient appropriate because: Remains on empiric broad-spectrum IV antibiotics pending culture results   Planned Discharge Destination: Home    Time spent: 40 minutes  Author: , DO 02/07/2022 5:22 PM  For on call review [email or web address].

## 2022-02-07 NOTE — Progress Notes (Signed)
2 Days Post-Op   Subjective/Chief Complaint: Patient seen.  Had some increased issues with pain overnight last night.   Objective: Vital signs in last 24 hours: Temp:  [97.6 F (36.4 C)-98.2 F (36.8 C)] 97.6 F (36.4 C) (07/13 0818) Pulse Rate:  [63-73] 66 (07/13 0818) Resp:  [16-18] 16 (07/13 0818) BP: (123-144)/(65-84) 144/80 (07/13 0818) SpO2:  [96 %-98 %] 97 % (07/13 0818) Last BM Date : 02/04/22  Intake/Output from previous day: 07/12 0701 - 07/13 0700 In: 1283.1 [P.O.:480; IV Piggyback:803.1] Out: -  Intake/Output this shift: Total I/O In: 240 [P.O.:240] Out: -   Bandage is dry and intact.  Upon removal there is a area of minimal dried blood on the bandaging with no purulence noted.  Incision is well coapted with continued improvement with erythema and edema.   Lab Results:  Recent Labs    02/04/22 1330 02/05/22 0519  WBC 8.2 6.9  HGB 13.9 12.3*  HCT 41.3 36.5*  PLT 281 256   BMET Recent Labs    02/04/22 1330 02/05/22 0519  NA 136 138  K 4.2 4.0  CL 103 108  CO2 23 25  GLUCOSE 107* 109*  BUN 18 17  CREATININE 1.22 1.15  CALCIUM 9.3 8.3*   PT/INR No results for input(s): "LABPROT", "INR" in the last 72 hours. ABG No results for input(s): "PHART", "HCO3" in the last 72 hours.  Invalid input(s): "PCO2", "PO2"  Studies/Results: No results found.  Anti-infectives: Anti-infectives (From admission, onward)    Start     Dose/Rate Route Frequency Ordered Stop   02/07/22 1600  vancomycin (VANCOREADY) IVPB 1750 mg/350 mL        1,750 mg 175 mL/hr over 120 Minutes Intravenous Every 24 hours 02/06/22 1102     02/05/22 0400  vancomycin (VANCOREADY) IVPB 750 mg/150 mL  Status:  Discontinued        750 mg 150 mL/hr over 60 Minutes Intravenous Every 12 hours 02/04/22 1557 02/06/22 1102   02/04/22 1545  ceFEPIme (MAXIPIME) 2 g in sodium chloride 0.9 % 100 mL IVPB        2 g 200 mL/hr over 30 Minutes Intravenous Every 8 hours 02/04/22 1532     02/04/22  1530  metroNIDAZOLE (FLAGYL) IVPB 500 mg  Status:  Discontinued        500 mg 100 mL/hr over 60 Minutes Intravenous Every 12 hours 02/04/22 1520 02/07/22 1020   02/04/22 1400  vancomycin (VANCOREADY) IVPB 2000 mg/400 mL        2,000 mg 200 mL/hr over 120 Minutes Intravenous  Once 02/04/22 1352 02/04/22 1903       Assessment/Plan: s/p Procedure(s): IRRIGATION AND DEBRIDEMENT Right Foot Wound (Right) Assessment: Stable status post I&D right foot.  Plan: Betadine wet to dry gauze and bandage reapplied to the right foot.  Still waiting on culture results and hopefully we will have something back tomorrow.  If not I may feel comfortable sending him home on combination of clindamycin and Cipro and adjust as needed pending culture results.  Patient was instructed that he will need to have the bandage changed every 2 to 3 days in similar fashion with Betadine gauze and a bandage.  Plan for 1 more dressing change hopefully tomorrow but if I cannot get to it he was instructed that his wife can reapply a new dressing on Saturday or Sunday.  LOS: 3 days    Ryan Pugh 02/07/2022

## 2022-02-08 DIAGNOSIS — E11628 Type 2 diabetes mellitus with other skin complications: Secondary | ICD-10-CM | POA: Diagnosis not present

## 2022-02-08 DIAGNOSIS — L089 Local infection of the skin and subcutaneous tissue, unspecified: Secondary | ICD-10-CM | POA: Diagnosis not present

## 2022-02-08 LAB — BASIC METABOLIC PANEL
Anion gap: 4 — ABNORMAL LOW (ref 5–15)
BUN: 29 mg/dL — ABNORMAL HIGH (ref 8–23)
CO2: 23 mmol/L (ref 22–32)
Calcium: 9 mg/dL (ref 8.9–10.3)
Chloride: 109 mmol/L (ref 98–111)
Creatinine, Ser: 1.27 mg/dL — ABNORMAL HIGH (ref 0.61–1.24)
GFR, Estimated: >60 mL/min (ref >60)
Glucose, Bld: 180 mg/dL — ABNORMAL HIGH (ref 70–99)
Potassium: 4.2 mmol/L (ref 3.5–5.1)
Sodium: 136 mmol/L (ref 135–145)

## 2022-02-08 LAB — GLUCOSE, CAPILLARY
Glucose-Capillary: 177 mg/dL — ABNORMAL HIGH (ref 70–99)
Glucose-Capillary: 207 mg/dL — ABNORMAL HIGH (ref 70–99)

## 2022-02-08 MED ORDER — CIPROFLOXACIN HCL 500 MG PO TABS: 500.0000 mg | ORAL_TABLET | Freq: Two times a day (BID) | ORAL | 0 refills | Status: DC

## 2022-02-08 MED ORDER — ASCORBIC ACID 500 MG PO TABS: 500.0000 mg | ORAL_TABLET | Freq: Two times a day (BID) | ORAL | Status: AC

## 2022-02-08 MED ORDER — CIPROFLOXACIN HCL 500 MG PO TABS: 500.0000 mg | ORAL_TABLET | Freq: Two times a day (BID) | ORAL | 0 refills | Status: AC

## 2022-02-08 MED ORDER — CLINDAMYCIN HCL 300 MG PO CAPS: 300.0000 mg | ORAL_CAPSULE | Freq: Three times a day (TID) | ORAL | 0 refills | Status: AC

## 2022-02-08 MED ORDER — OXYCODONE-ACETAMINOPHEN 5-325 MG PO TABS
1.0000 | ORAL_TABLET | ORAL | 0 refills | Status: DC | PRN
Start: 2022-02-08 — End: 2023-07-21

## 2022-02-08 MED ORDER — ADULT MULTIVITAMIN W/MINERALS CH: 1.0000 | ORAL_TABLET | Freq: Every day | ORAL | Status: DC

## 2022-02-08 MED ORDER — ZINC SULFATE 220 (50 ZN) MG PO CAPS
220.0000 mg | ORAL_CAPSULE | Freq: Every day | ORAL | 0 refills | Status: AC
Start: 2022-02-09 — End: 2022-02-23

## 2022-02-08 NOTE — Progress Notes (Signed)
Per nurse on previous day shift Pt is no longer on TELE,  order was DC'ed.

## 2022-02-08 NOTE — Progress Notes (Signed)
3 Days Post-Op   Subjective/Chief Complaint: Patient seen.  Doing better this morning.  States he slept better last night.   Objective: Vital signs in last 24 hours: Temp:  [98.1 F (36.7 C)-98.7 F (37.1 C)] 98.1 F (36.7 C) (07/14 0823) Pulse Rate:  [69-74] 72 (07/14 0823) Resp:  [16-20] 18 (07/14 0823) BP: (140-162)/(70-86) 162/86 (07/14 0823) SpO2:  [95 %-99 %] 99 % (07/14 0823) Last BM Date : 02/04/22  Intake/Output from previous day: 07/13 0701 - 07/14 0700 In: 600 [P.O.:600] Out: -  Intake/Output this shift: No intake/output data recorded.  Bandage is dry and intact.  Upon removal no evidence of draining from the open portion of the wound which appears to be well coapted and dry today.  Only mild edema and erythema.  Lab Results:  No results for input(s): "WBC", "HGB", "HCT", "PLT" in the last 72 hours. BMET Recent Labs    02/08/22 0534  NA 136  K 4.2  CL 109  CO2 23  GLUCOSE 180*  BUN 29*  CREATININE 1.27*  CALCIUM 9.0   PT/INR No results for input(s): "LABPROT", "INR" in the last 72 hours. ABG No results for input(s): "PHART", "HCO3" in the last 72 hours.  Invalid input(s): "PCO2", "PO2"  Studies/Results: No results found.  Anti-infectives: Anti-infectives (From admission, onward)    Start     Dose/Rate Route Frequency Ordered Stop   02/08/22 0000  ciprofloxacin (CIPRO) 500 MG tablet  Status:  Discontinued        500 mg Oral 2 times daily 02/08/22 1120 02/08/22    02/08/22 0000  clindamycin (CLEOCIN) 300 MG capsule        300 mg Oral 3 times daily 02/08/22 1120 02/18/22 2359   02/08/22 0000  ciprofloxacin (CIPRO) 500 MG tablet        500 mg Oral 2 times daily 02/08/22 1127 02/18/22 2359   02/07/22 1600  vancomycin (VANCOREADY) IVPB 1750 mg/350 mL        1,750 mg 175 mL/hr over 120 Minutes Intravenous Every 24 hours 02/06/22 1102     02/05/22 0400  vancomycin (VANCOREADY) IVPB 750 mg/150 mL  Status:  Discontinued        750 mg 150 mL/hr over  60 Minutes Intravenous Every 12 hours 02/04/22 1557 02/06/22 1102   02/04/22 1545  ceFEPIme (MAXIPIME) 2 g in sodium chloride 0.9 % 100 mL IVPB        2 g 200 mL/hr over 30 Minutes Intravenous Every 8 hours 02/04/22 1532     02/04/22 1530  metroNIDAZOLE (FLAGYL) IVPB 500 mg  Status:  Discontinued        500 mg 100 mL/hr over 60 Minutes Intravenous Every 12 hours 02/04/22 1520 02/07/22 1020   02/04/22 1400  vancomycin (VANCOREADY) IVPB 2000 mg/400 mL        2,000 mg 200 mL/hr over 120 Minutes Intravenous  Once 02/04/22 1352 02/04/22 1903       Assessment/Plan: s/p Procedure(s): IRRIGATION AND DEBRIDEMENT Right Foot Wound (Right) Assessment: Stable status post I&D puncture wound right foot.  Plan: Betadine and sterile bandage applied to the right foot.  Discussed with the patient that he will not need any dressing changes until I see him in the office.  Keep the bandage clean and dry.  I think the patient should be stable for discharge on oral antibiotics with follow-up pending culture results.  I will follow-up with the patient next week on Tuesday or Wednesday.  LOS: 4 days  Ricci Barker 02/08/2022

## 2022-02-08 NOTE — Progress Notes (Signed)
Discharge instructions were given. Questions were encourage and answered. IV site was dc. Belongings were given to pt's family.

## 2022-02-08 NOTE — Plan of Care (Signed)
  Problem: Education: Goal: Knowledge of General Education information will improve Description: Including pain rating scale, medication(s)/side effects and non-pharmacologic comfort measures Outcome: Adequate for Discharge   Problem: Health Behavior/Discharge Planning: Goal: Ability to manage health-related needs will improve Outcome: Adequate for Discharge   Problem: Clinical Measurements: Goal: Ability to maintain clinical measurements within normal limits will improve Outcome: Adequate for Discharge Goal: Will remain free from infection Outcome: Adequate for Discharge Goal: Diagnostic test results will improve Outcome: Adequate for Discharge Goal: Respiratory complications will improve Outcome: Adequate for Discharge Goal: Cardiovascular complication will be avoided Outcome: Adequate for Discharge   Problem: Activity: Goal: Risk for activity intolerance will decrease Outcome: Adequate for Discharge   Problem: Nutrition: Goal: Adequate nutrition will be maintained Outcome: Adequate for Discharge   Problem: Coping: Goal: Level of anxiety will decrease Outcome: Adequate for Discharge   Problem: Elimination: Goal: Will not experience complications related to bowel motility Outcome: Adequate for Discharge Goal: Will not experience complications related to urinary retention Outcome: Adequate for Discharge   Problem: Pain Managment: Goal: General experience of comfort will improve Outcome: Adequate for Discharge   Problem: Safety: Goal: Ability to remain free from injury will improve Outcome: Adequate for Discharge   Problem: Skin Integrity: Goal: Risk for impaired skin integrity will decrease Outcome: Adequate for Discharge   Problem: Education: Goal: Ability to describe self-care measures that may prevent or decrease complications (Diabetes Survival Skills Education) will improve Outcome: Adequate for Discharge Goal: Individualized Educational Video(s) Outcome:  Adequate for Discharge   Problem: Coping: Goal: Ability to adjust to condition or change in health will improve Outcome: Adequate for Discharge   Problem: Fluid Volume: Goal: Ability to maintain a balanced intake and output will improve Outcome: Adequate for Discharge   Problem: Health Behavior/Discharge Planning: Goal: Ability to identify and utilize available resources and services will improve Outcome: Adequate for Discharge Goal: Ability to manage health-related needs will improve Outcome: Adequate for Discharge   Problem: Metabolic: Goal: Ability to maintain appropriate glucose levels will improve Outcome: Adequate for Discharge   Problem: Nutritional: Goal: Maintenance of adequate nutrition will improve Outcome: Adequate for Discharge Goal: Progress toward achieving an optimal weight will improve Outcome: Adequate for Discharge   Problem: Skin Integrity: Goal: Risk for impaired skin integrity will decrease Outcome: Adequate for Discharge   Problem: Tissue Perfusion: Goal: Adequacy of tissue perfusion will improve Outcome: Adequate for Discharge   Problem: Education: Goal: Required Educational Video(s) Outcome: Adequate for Discharge   Problem: Clinical Measurements: Goal: Postoperative complications will be avoided or minimized Outcome: Adequate for Discharge   Problem: Skin Integrity: Goal: Demonstration of wound healing without infection will improve Outcome: Adequate for Discharge

## 2022-02-08 NOTE — Discharge Summary (Signed)
Physician Discharge Summary   Patient: Ryan Pugh MRN: [id number] DOB: 04/08/1956  Admit date:     02/04/2022  Discharge date: 02/08/2022  Discharge Physician: Ezekiel Slocumb   PCP: Su Grand, MD   Recommendations at discharge:  {Tip this will not be part of the note when signed- Example include specific recommendations for outpatient follow-up, pending tests to follow-up on. (Optional):26781}  Follow up with Podiatry, Dr. Cleda Mccreedy next week Follow up with Primary Care in 1-2 weeks Repeat BMP, CBC in 1-2 weeks  Discharge Diagnoses: Principal Problem:   Diabetic foot infection ([place]) Active Problems:   Diabetes mellitus without complication ([place])   DM type 2 causing CKD stage 3 ([place])   HLD (hyperlipidemia)   Puncture wound  Resolved Problems:   * No resolved hospital problems. *  Hospital Course: Ryan Pugh is a 67 y.o. male with medical history significant for diabetes mellitus, dyslipidemia, BPH presented to hospital with severe right foot pain with swelling.  He did have a puncture wound and was seen in the ED 01/30/2022 after stepping on a old nail.  He was discharged home on oral Cipro but foot continued to have swelling redness and pain so he decided to come to the hospital.  In the ED right foot x-ray was done without any osseous abnormality.  Patient was given IV antibiotics and was admitted to hospital for puncture wound with cellulitis in a diabetic patient.    Podiatry was consulted and took patient to the OR for I&D on 02/05/2022.  Wound cultures sent.  Assessment and Plan: * Diabetic foot infection ([place]) Infection developed following a puncture wound. Patient has increased risk factors for both Pseudomonas and MRSA.  MRI of the right foot on 7/10 was negative for signs of osteomyelitis. --Continue empiric vancomycin and cefepime until cultures result -- Stop Flagyl --Podiatry following -- Status post I&D in the OR on 7/11 -- Follow wound cultures and tailor  antibiotics accordingly --Wound care per podiatry's instructions  Diabetes mellitus without complication ([facility]) Hold oral hypoglycemic agent Sliding scale NovoLog Add 3 units NovoLog 3 times daily  Puncture wound As outlined  HLD (hyperlipidemia) Continue simvastatin  DM type 2 causing CKD stage 3 ([facility]) On insulin as outlined. Creatinine within normal limits 1.15 -- Monitor BMP      {Tip this will not be part of the note when signed Body mass index is 27.31 kg/m. ,  Nutrition Documentation    Flowsheet Row ED to Hosp-Admission (Current) from 02/04/2022 in [facility]  Nutrition Problem Increased nutrient needs  Etiology wound healing  Nutrition Goal Patient will meet greater than or equal to 90% of their needs     ,  (Optional):26781}  {(NOTE) Pain control PDMP Statment (Optional):26782} Consultants: *** Procedures performed: ***  Disposition: {Plan; Disposition:26390} Diet recommendation:  Discharge Diet Orders (From admission, onward)     Start     Ordered   02/08/22 0000  Diet - low sodium heart healthy        02/08/22 1120           {Diet_Plan:26776} DISCHARGE MEDICATION: Allergies as of 02/08/2022   No Known Allergies      Medication List     STOP taking these medications    cetirizine 10 MG tablet Commonly known as: ZYRTEC   ondansetron 4 MG disintegrating tablet Commonly known as: ZOFRAN-ODT   tamsulosin 0.4 MG Caps capsule Commonly known as: FLOMAX       TAKE  these medications    ascorbic acid 500 MG tablet Commonly known as: VITAMIN C Take 1 tablet (500 mg total) by mouth 2 (two) times daily.   ciprofloxacin 500 MG tablet Commonly known as: Cipro Take 1 tablet (500 mg total) by mouth 2 (two) times daily for 10 days.   clindamycin 300 MG capsule Commonly known as: CLEOCIN Take 1 capsule (300 mg total) by mouth 3 (three) times daily for 10 days.   glimepiride 4 MG tablet Commonly known  as: AMARYL Take 4 mg by mouth 2 (two) times daily.   ibuprofen 200 MG tablet Commonly known as: ADVIL Take 200 mg by mouth every 6 (six) hours as needed for fever, mild pain or moderate pain.   Januvia 100 MG tablet Generic drug: sitaGLIPtin Take 100 mg by mouth daily.   Lantus SoloStar 100 UNIT/ML Solostar Pen Generic drug: insulin glargine Inject 14-17 Units into the skin daily.   metFORMIN 500 MG tablet Commonly known as: GLUCOPHAGE Take 1,000 mg by mouth 2 (two) times daily.   multivitamin with minerals Tabs tablet Take 1 tablet by mouth daily.   oxyCODONE-acetaminophen 5-325 MG tablet Commonly known as: PERCOCET/ROXICET Take 1-2 tablets by mouth every 4 (four) hours as needed for severe pain. What changed: how much to take   simvastatin 40 MG tablet Commonly known as: ZOCOR Take 40 mg by mouth at bedtime.   zinc sulfate 220 (50 Zn) MG capsule Take 1 capsule (220 mg total) by mouth daily for 14 days. Start taking on: February 09, 2022               Discharge Care Instructions  (From admission, onward)           Start     Ordered   02/08/22 0000  Discharge wound care:       Comments: Per podiatry's instructions.   02/08/22 1120            Discharge Exam: Filed Weights   02/04/22 1209 02/05/22 1144  Weight: 83.9 kg 83.9 kg   ***  Condition at discharge: {DC Condition:26389}  The results of significant diagnostics from this hospitalization (including imaging, microbiology, ancillary and laboratory) are listed below for reference.   Imaging Studies: MR FOOT RIGHT W WO CONTRAST  Result Date: 02/04/2022 CLINICAL DATA:  Right foot pain, diabetes. EXAM: MRI OF THE RIGHT FOREFOOT WITHOUT AND WITH CONTRAST TECHNIQUE: Multiplanar, multisequence MR imaging of the right forefoot was performed before and after the administration of intravenous contrast. CONTRAST:  35mL GADAVIST GADOBUTROL 1 MMOL/ML IV SOLN COMPARISON:  Radiographs 02/04/2022 FINDINGS:  Bones/Joint/Cartilage No significant marrow edema or enhancement indicate osteomyelitis. No bony destructive findings. Spurring at the first digit MTP joint with a small degenerative subcortical cyst along the plantar base of the proximal phalanx great toe. Spurring at the articulation of the first metatarsal in the sesamoids. Ligaments Lisfranc ligament intact. Muscles and Tendons Mild subcutaneous edema and enhancement along the flexor digitorum longus tendons. Soft tissues In the plantar subcutaneous tissues along the ball of the foot the level of the third digit there is abnormal edema signal surrounding a 5 by 5 mm T1 and T2 signal hypointensity suspicious for a small amount of subcutaneous gas. There is also small amount of cutaneous or subcutaneous gas further plantar on image 17 series 8. The appearance is compatible with soft tissue infection and/or puncture wound. This corresponds to the location of the suspected soft tissue gas on radiography. IMPRESSION: 1. Focal inflammation and a  small amount soft tissue gas in the plantar subcutaneous tissues along the ball of the foot at the level of the third digit (about at the distal level of the proximal metaphysis of the third proximal phalanx on image 12 series 9). No large drainable abscess is observed. 2. Low-level edema and enhancement tracking along the flexor digitorum longus tendon distally compatible with inflammation. No gas or drainable abscess tracks along the margins of the tendon. 3. Degenerative findings at the first MTP joint. Electronically Signed   By: Gaylyn Rong M.D.   On: 02/04/2022 16:04   DG Foot Complete Right  Result Date: 02/04/2022 CLINICAL DATA:  Pain and redness of the foot EXAM: RIGHT FOOT COMPLETE - 3+ VIEW COMPARISON:  None Available. FINDINGS: No acute fracture or dislocation. No aggressive osseous lesion. Normal alignment. Mild osteoarthritis of the first MTP joint. Soft tissue edema along the plantar aspect of the  right forefoot with a small amount air concerning for cellulitis with air which may be secondary to the puncture wound versus necrotizing infection. No radiopaque foreign body or soft tissue emphysema. Peripheral vascular atherosclerotic disease. IMPRESSION: 1.  No acute osseous injury of the right foot. 2. Soft tissue edema along the plantar aspect of the right forefoot with a small amount air concerning for cellulitis with air which may be secondary to the puncture wound versus necrotizing infection. Electronically Signed   By: Elige Ko M.D.   On: 02/04/2022 13:44   DG Foot Complete Right  Result Date: 01/30/2022 CLINICAL DATA:  Possible foreign body EXAM: RIGHT FOOT COMPLETE - 3+ VIEW COMPARISON:  None Available. FINDINGS: There is no evidence of fracture or dislocation. There is no evidence of arthropathy or other focal bone abnormality. Soft tissues are unremarkable. IMPRESSION: Negative. Electronically Signed   By: Deatra Robinson M.D.   On: 01/30/2022 02:58    Microbiology: Results for orders placed or performed during the hospital encounter of 02/04/22  Aerobic/Anaerobic Culture w Gram Stain (surgical/deep wound)     Status: None (Preliminary result)   Collection Time: 02/05/22 12:51 PM   Specimen: Wound; Abscess  Result Value Ref Range Status   Specimen Description   Final    WOUND Performed at [facility], [address], [place], [place] [address]    Special Requests   Final    right foot puncture wound abscess Performed at [facility], [address] [place], [place] [address]    Gram Stain   Final    RARE WBC PRESENT, PREDOMINANTLY MONONUCLEAR RARE GRAM VARIABLE ROD    Culture   Final    CULTURE REINCUBATED FOR BETTER GROWTH Performed at [facility], [address] [place], [place] [address]    Report Status PENDING  Incomplete    Labs: CBC: Recent Labs  Lab 02/04/22 1330 02/05/22 0519  WBC 8.2 6.9  NEUTROABS 5.4  --   HGB 13.9 12.3*   HCT 41.3 36.5*  MCV 87.9 87.5  PLT 281 256   Basic Metabolic Panel: Recent Labs  Lab 02/04/22 1330 02/05/22 0519 02/08/22 0534  NA 136 138 136  K 4.2 4.0 4.2  CL 103 108 109  CO2 23 25 23   GLUCOSE 107* 109* 180*  BUN 18 17 29*  CREATININE 1.22 1.15 1.27*  CALCIUM 9.3 8.3* 9.0   Liver Function Tests: No results for input(s): "AST", "ALT", "ALKPHOS", "BILITOT", "PROT", "ALBUMIN" in the last 168 hours. CBG: Recent Labs  Lab 02/07/22 0816 02/07/22 1136 02/07/22 1643  02/07/22 2107 02/08/22 0826  GLUCAP 125* 170* 165* 217* 177*    Discharge time spent: {LESS THAN/GREATER THAN:26388} 30 minutes.  Signed: Pennie Banter, DO [facility] 02/08/2022

## 2022-02-10 LAB — AEROBIC/ANAEROBIC CULTURE W GRAM STAIN (SURGICAL/DEEP WOUND): Culture: NORMAL

## 2022-10-16 IMAGING — CT CT ABD-PELV W/ CM
2 of 5 series · 15 of 46 positions shown, 17 images · IV contrast (APPLIED)
Comparison: None.

CLINICAL DATA: Acute left lower quadrant abdominal pain.

EXAM:
CT ABDOMEN AND PELVIS WITH CONTRAST
TECHNIQUE: Multidetector CT imaging of the abdomen and pelvis was performed
using the standard protocol following bolus administration of
intravenous contrast.
CONTRAST:  80mL OMNIPAQUE IOHEXOL 300 MG/ML  SOLN

[Series 2: axial st · axial · 0.96mm/px · z∈[-1012,-502]mm · 12 of 115 slices shown, 14 images]
[im 7/115  soft-tissue]
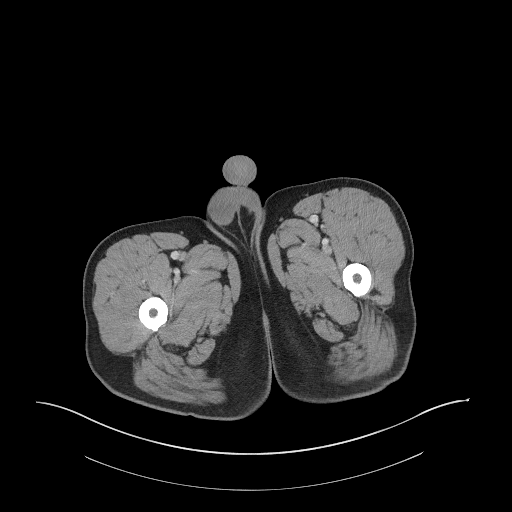
[im 7/115  bone]
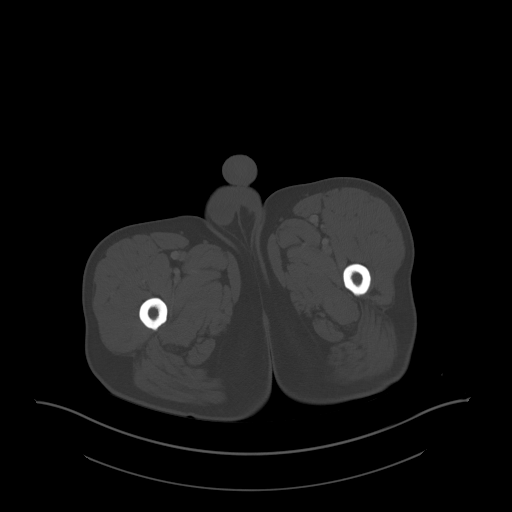
[im 19/115  soft-tissue]
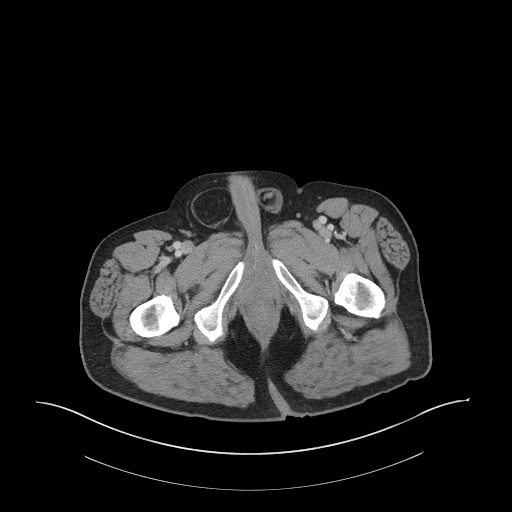
[im 25/115  soft-tissue]
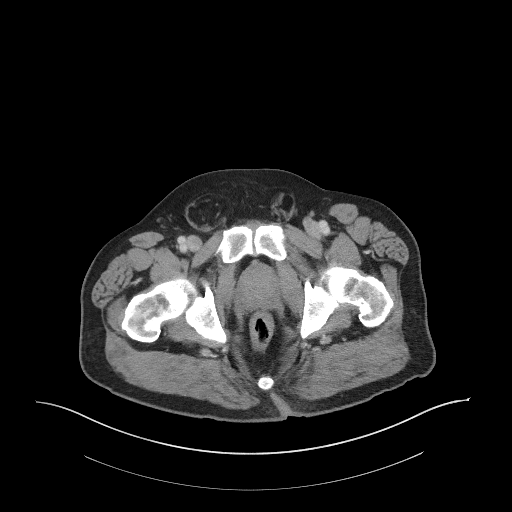
[im 37/115  soft-tissue]
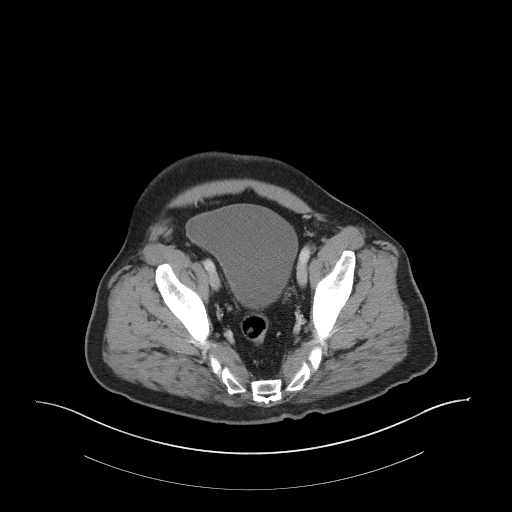
[im 43/115  soft-tissue]
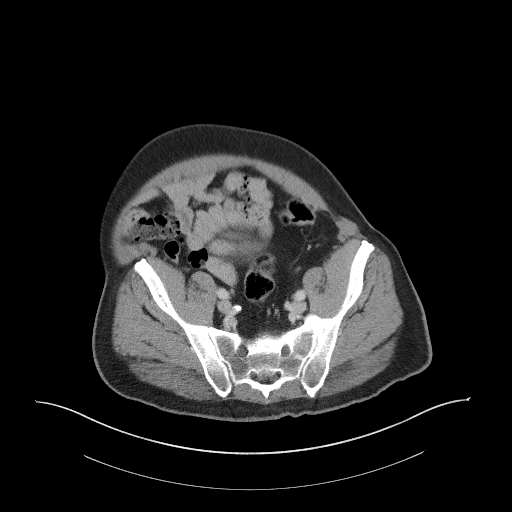
[im 55/115  soft-tissue]
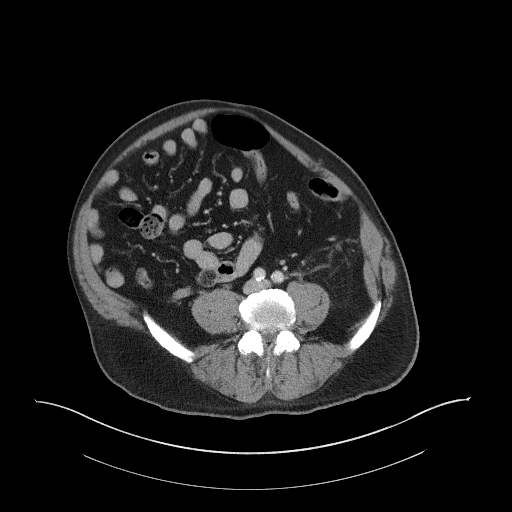
[im 61/115  soft-tissue]
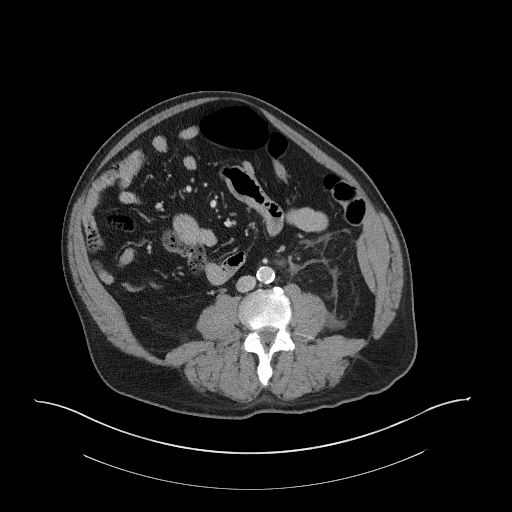
[im 73/115  soft-tissue]
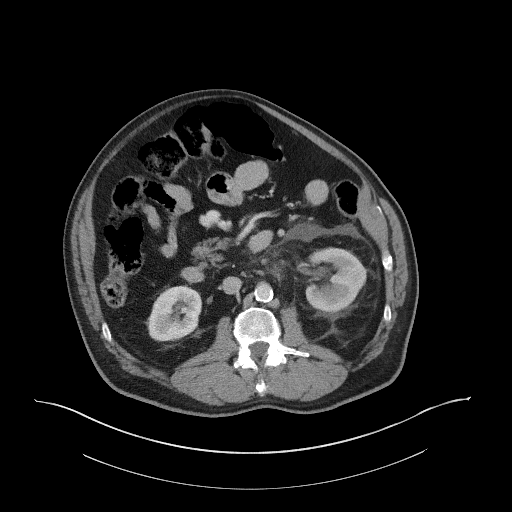
[im 79/115  soft-tissue]
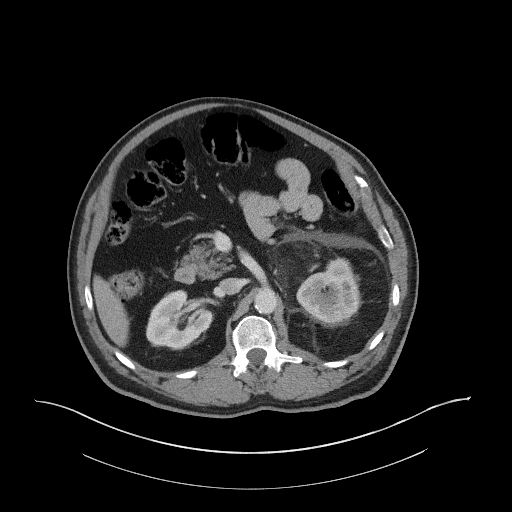
[im 79/115  bone]
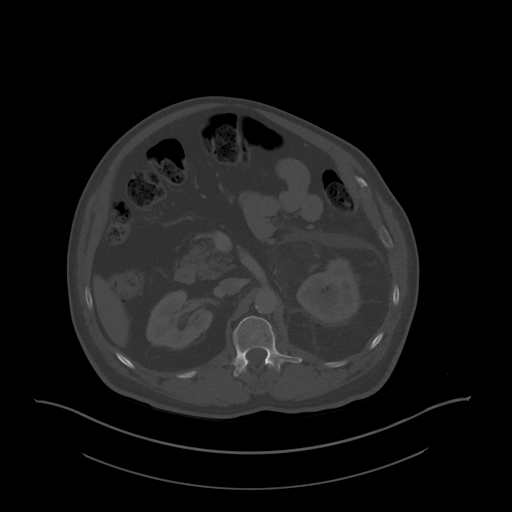
[im 91/115  soft-tissue]
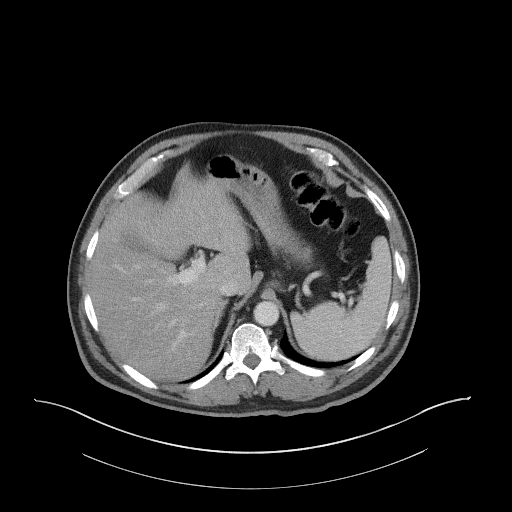
[im 97/115  soft-tissue]
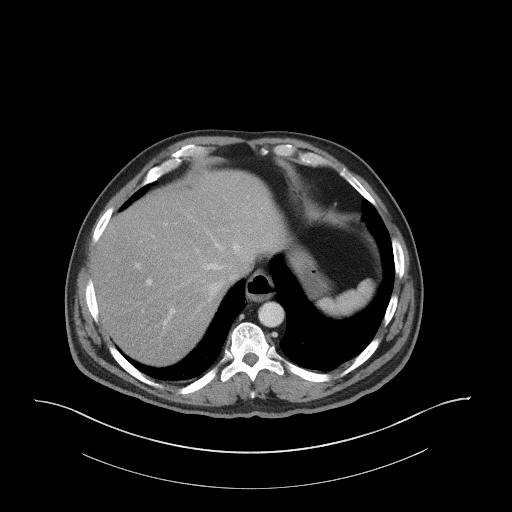
[im 109/115  soft-tissue]
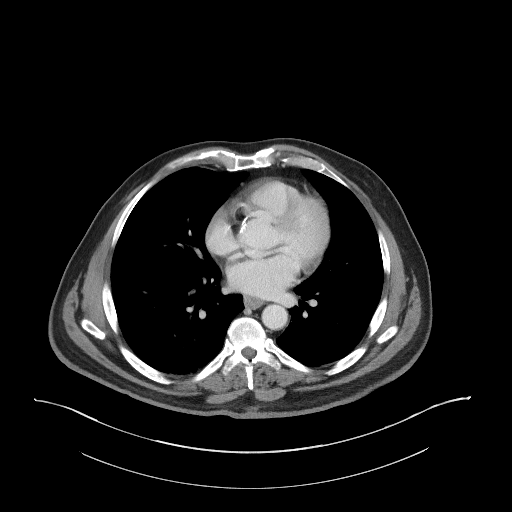

[Series 5: coronal st · coronal · 0.76mm/px · 3 of 110 slices shown]
[im 37/110  soft-tissue]
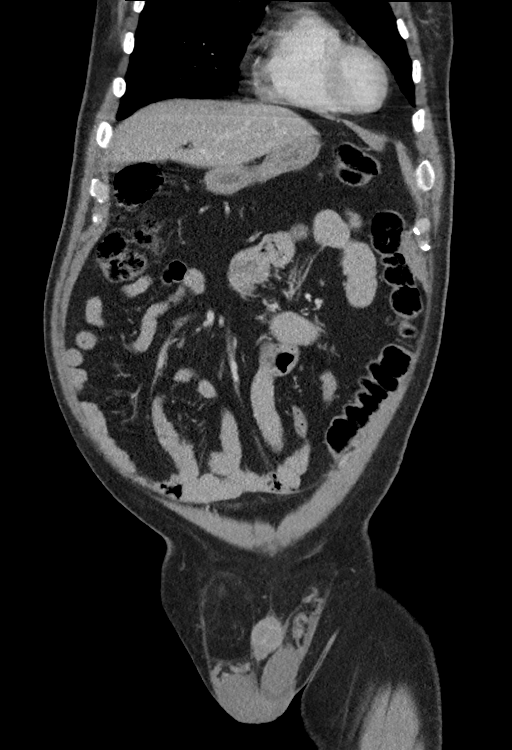
[im 49/110  soft-tissue]
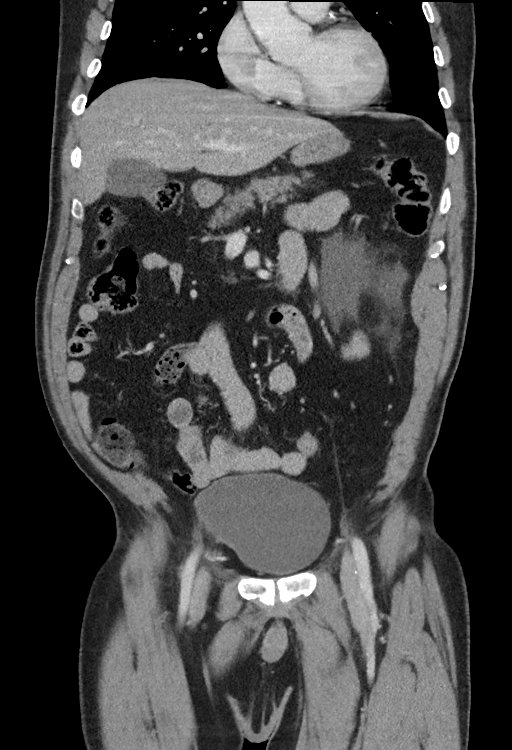
[im 61/110  soft-tissue]
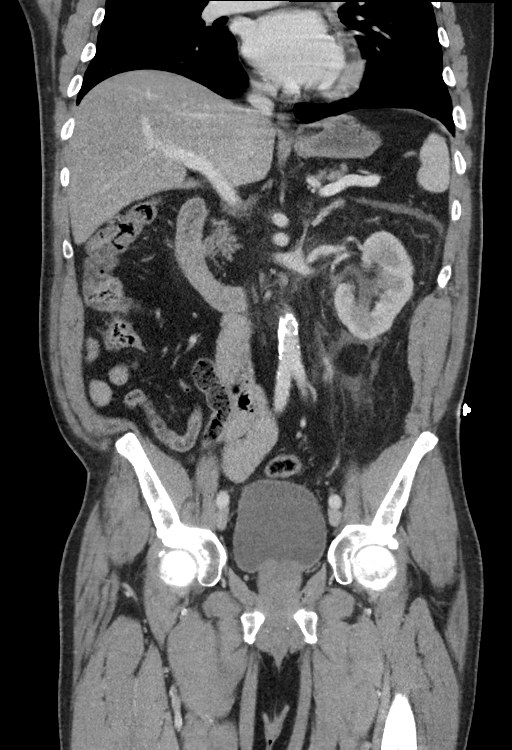

[15 of 46 positions shown; findings below may reference images not displayed]

FINDINGS: Lower chest: No acute abnormality.

Hepatobiliary: Probable hepatic steatosis. No gallstones,
gallbladder wall thickening, or biliary dilatation.

Pancreas: Unremarkable. No pancreatic ductal dilatation or
surrounding inflammatory changes.

Spleen: Normal in size without focal abnormality.

Adrenals/Urinary Tract: Adrenal glands appear normal. Right kidney
and ureter are unremarkable. Minimal left hydroureteronephrosis is
noted with perinephric stranding which appears to be due to focal
high density area is seen in the midportion of the left ureter. It
is uncertain if this represents small stone or stones or possibly
blood clot. Small nonobstructive calculus is noted in lower pole
collecting system of left kidney. 3 cm bladder calculus is noted in
dependent portion of urinary bladder. Small portion of the urinary
bladder is seen extending into moderate size right inguinal hernia.

Stomach/Bowel: Stomach is within normal limits. Appendix appears
normal. No evidence of bowel wall thickening, distention, or
inflammatory changes.

Vascular/Lymphatic: Aortic atherosclerosis. No enlarged abdominal or
pelvic lymph nodes.

Reproductive: Prostate is unremarkable.

Other: No ascites is noted. Moderate size predominantly fat
containing right inguinal hernia is noted which does contain a small
portion of the urinary bladder as detailed above.

Musculoskeletal: No acute or significant osseous findings.
IMPRESSION: Minimal left hydroureteronephrosis is noted with perinephric
stranding which appears to be due to focal high density area seen in
midportion of left ureter. It is uncertain if this represents small
stone or stones or possibly blood clot. Small nonobstructive
calculus is noted in lower pole collecting system of left kidney.

3 cm bladder calculus is noted in dependent portion of urinary
bladder.

Moderate sized right inguinal hernia is noted which contains a small
portion of the urinary bladder.

Probable hepatic steatosis.

Aortic Atherosclerosis (305TM-C9G.G).

## 2023-04-02 ENCOUNTER — Encounter: Payer: Self-pay | Admitting: *Deleted

## 2023-07-21 ENCOUNTER — Emergency Department: Payer: Managed Care, Other (non HMO)

## 2023-07-21 ENCOUNTER — Emergency Department
Admission: EM | Admit: 2023-07-21 | Discharge: 2023-07-21 | Disposition: A | Discharge status: Home / Self Care | Payer: Managed Care, Other (non HMO) | Attending: Emergency Medicine | Admitting: Emergency Medicine

## 2023-07-21 ENCOUNTER — Other Ambulatory Visit: Payer: Self-pay

## 2023-07-21 DIAGNOSIS — E1122 Type 2 diabetes mellitus with diabetic chronic kidney disease: Secondary | ICD-10-CM | POA: Insufficient documentation

## 2023-07-21 DIAGNOSIS — R202 Paresthesia of skin: Secondary | ICD-10-CM | POA: Insufficient documentation

## 2023-07-21 DIAGNOSIS — M546 Pain in thoracic spine: Secondary | ICD-10-CM | POA: Diagnosis not present

## 2023-07-21 DIAGNOSIS — S161XXA Strain of muscle, fascia and tendon at neck level, initial encounter: Secondary | ICD-10-CM

## 2023-07-21 DIAGNOSIS — M542 Cervicalgia: Secondary | ICD-10-CM | POA: Diagnosis present

## 2023-07-21 DIAGNOSIS — Y92410 Unspecified street and highway as the place of occurrence of the external cause: Secondary | ICD-10-CM | POA: Diagnosis not present

## 2023-07-21 DIAGNOSIS — N189 Chronic kidney disease, unspecified: Secondary | ICD-10-CM | POA: Insufficient documentation

## 2023-07-21 DIAGNOSIS — S29012A Strain of muscle and tendon of back wall of thorax, initial encounter: Secondary | ICD-10-CM

## 2023-07-21 MED ORDER — METHOCARBAMOL 500 MG PO TABS: 500.0000 mg | ORAL_TABLET | Freq: Four times a day (QID) | ORAL | 0 refills | Status: DC
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
## 2024-02-25 ENCOUNTER — Ambulatory Visit: Admitting: Nurse Practitioner

## 2024-03-18 LAB — LIPID PANEL
Cholesterol: 176 (ref 0–200)
HDL: 30 — AB (ref 35–70)
LDL Cholesterol: 121
Triglycerides: 135 (ref 40–160)

## 2024-03-18 LAB — BASIC METABOLIC PANEL WITH GFR: Glucose: 242

## 2024-03-18 LAB — HEMOGLOBIN A1C: Hemoglobin A1C: 10.1

## 2024-03-22 ENCOUNTER — Ambulatory Visit: Payer: Self-pay

## 2024-03-22 NOTE — Telephone Encounter (Signed)
 FYI Only or Action Required?: FYI only for provider.  Patient was last seen in primary care on new patient appt established.  Called Nurse Triage reporting Blood Sugar Problem.  Symptoms began Ongoing.  Interventions attempted: Prescription medications: Metformin, Januvia .  Symptoms are: stable.  Triage Disposition: Home Care  Patient/caregiver understands and will follow disposition?: Yes    Copied from CRM [id number]. Topic: Clinical - Red Word Triage >> Mar 22, 2024  8:34 AM Tiffany B wrote: Red Word that prompted transfer to Nurse Triage: Spouse states patient blood sugar is 360 or 380  feet are tingling. Reason for Disposition  Blood glucose 70-240 mg/dL (3.9 -86.6 mmol/L)  Answer Assessment - Initial Assessment Questions 1. BLOOD GLUCOSE: What is your blood glucose level?      Patient states it's been riding high around 360-380 and his A1C is in high in the 10's. Patient states recently his fasting sugar has been 260-280 but he upped his lantus  and he is starting the day around 180 glucose level fasting 2. ONSET: When did you check the blood glucose?     Ongoing issue, checks daily 4. KETONES: Do you check for ketones (urine or blood test strips)? If Yes, ask: What does the test show now?      No 5. TYPE 1 or 2:  Do you know what type of diabetes you have?  (e.g., Type 1, Type 2, Gestational; doesn't know)      Type 2 6. INSULIN : Do you take insulin ? What type of insulin (s) do you use? What is the mode of delivery? (syringe, pen; injection or pump)?      Lantus  7. DIABETES PILLS: Do you take any pills for your diabetes? If Yes, ask: Have you missed taking any pills recently?     Rybelsus, Januvia, Metformin 8. OTHER SYMPTOMS: Do you have any symptoms? (e.g., fever, frequent urination, difficulty breathing, dizziness, weakness, vomiting)     Ongoing tingling in feet (chronic)  Patient states he doesn't normally experience symptoms of high or low blood  sugar.  Protocols used: Diabetes - High Blood Sugar-A-AH

## 2024-04-21 ENCOUNTER — Encounter: Payer: Self-pay | Admitting: Internal Medicine

## 2024-04-21 ENCOUNTER — Other Ambulatory Visit: Payer: Self-pay

## 2024-04-21 ENCOUNTER — Ambulatory Visit: Admitting: Internal Medicine

## 2024-04-21 VITALS — BP 140/82 | HR 75 | Temp 97.9°F | Resp 16 | Wt 191.8 lb

## 2024-04-21 DIAGNOSIS — Z1211 Encounter for screening for malignant neoplasm of colon: Secondary | ICD-10-CM | POA: Diagnosis not present

## 2024-04-21 DIAGNOSIS — E1142 Type 2 diabetes mellitus with diabetic polyneuropathy: Secondary | ICD-10-CM

## 2024-04-21 DIAGNOSIS — Z794 Long term (current) use of insulin: Secondary | ICD-10-CM | POA: Diagnosis not present

## 2024-04-21 MED ORDER — LANTUS SOLOSTAR 100 UNIT/ML ~~LOC~~ SOPN: 25.0000 [IU] | PEN_INJECTOR | Freq: Every day | SUBCUTANEOUS | 2 refills | Status: AC

## 2024-04-21 MED ORDER — RYBELSUS 7 MG PO TABS: 7.0000 mg | ORAL_TABLET | Freq: Every day | ORAL | 0 refills | Status: DC

## 2024-04-21 NOTE — Progress Notes (Signed)
 New Patient Office Visit  Subjective    Patient ID: Ryan Pugh, male    DOB: 02-13-1956  Age: 68 y.o. MRN: [id number]  CC:  Chief Complaint  Patient presents with   Establish Care   Diabetes    BS 137 in the Am.  HgbA1c 9.6 in August    HPI [name] presents to establish care.  Discussed the use of AI scribe software for clinical note transcription with the patient, who gave verbal consent to proceed.  History of Present Illness Ryan Pugh is a 68 year old male with diabetes who presents for management of his blood sugar levels.  He was diagnosed with diabetes in his mid-30s and has been on various medications. His blood sugar levels became uncontrolled in the mid-2000s, leading to insulin  therapy. His A1c improved to around 7 after obtaining insurance in 2014 but increased to 8 during the COVID-19 pandemic and 9.6 last year due to insurance issues. He currently takes metformin, glimepiride, Januvia, and Rybelsus , with recent challenges in Rybelsus  coverage. He adjusts his Lantus  dose based on blood sugar readings, with a recent 30-day average of 172 mg/dL and an estimated J8r of 7.6.  In 2023, he had a kidney infection and sepsis from a blocked left kidney due to a bladder stone, requiring surgical intervention and a stent placement. 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 He has neuropathy in his toes, described as 'like wearing rubber gloves'.  His family history includes diabetes and kidney stones in his father and sister.   Diabetes, Type 2: -Last A1c 9.6% 8/25 at [facility] - will upload results to mychart -Diagnosed in mid-30's but became more uncontrolled around 2010  -Medications: Metformin 1000 mg BID, Glimepiride 4 mg BID, Rybelsus  3 mg, Januvia 100 mg, Lantus  25 units at bedtime  -Patient is compliant with the above medications and reports no side effects.  -Checking BG at home: average  over last 30 days 172, this morning 137 -Eye exam: Due -Foot exam: Due -Microalbumin: Due -Statin: No -PNA vaccine: Discuss at follow up -Denies symptoms of hypoglycemia, polyuria, polydipsia. Does have some numbness/tingling and history of gangrene/ulcer.  HLD: -Medications: Nothing currently, had been taking Zocor  40 mg but no longer because cholesterol did not change  -Last lipid panel: just had [facility] - will obtain results   Health Maintenance: -Blood work UTD - will bring results to follow up -Colon cancer screening due    Outpatient Encounter Medications as of 04/21/2024  Medication Sig   ascorbic acid  (VITAMIN C) 500 MG tablet Take 1 tablet (500 mg total) by mouth 2 (two) times daily.   glimepiride (AMARYL) 4 MG tablet Take 4 mg by mouth 2 (two) times daily.   ibuprofen (ADVIL) 200 MG tablet Take 200 mg by mouth every 6 (six) hours as needed for fever, mild pain or moderate pain.   JANUVIA 100 MG tablet Take 100 mg by mouth daily.   metFORMIN (GLUCOPHAGE) 500 MG tablet Take 1,000 mg by mouth 2 (two) times daily.   RYBELSUS  3 MG TABS 3 mg daily.   insulin  glargine (LANTUS  SOLOSTAR) 100 UNIT/ML Solostar Pen Inject 14-17 Units into the skin daily. (Patient not taking: Reported on 02/04/2022)   methocarbamol  (ROBAXIN ) 500 MG tablet Take 1 tablet (500 mg total) by mouth 4 (four) times daily.   Multiple Vitamin (MULTIVITAMIN WITH MINERALS) TABS tablet Take 1 tablet by mouth daily.   simvastatin  (ZOCOR ) 40  MG tablet Take 40 mg by mouth at bedtime.   No facility-administered encounter medications on file as of 04/21/2024.    Past Medical History:  Diagnosis Date   BPH (benign prostatic hyperplasia)    CKD (chronic kidney disease), stage III (HCC)    Diabetes mellitus without complication (HCC)    type 2   HLD (hyperlipidemia)    Kidney stone     Past Surgical History:  Procedure Laterality Date   CYSTOSCOPY W/ RETROGRADES  08/03/2021   Procedure: CYSTOSCOPY WITH RETROGRADE  PYELOGRAM;  Surgeon: Francisca Redell BROCKS, MD;  Location: [facility];  Service: Urology;;   CYSTOSCOPY W/ URETERAL STENT PLACEMENT Left 07/13/2021   Procedure: CYSTOSCOPY WITH RETROGRADE PYELOGRAM/URETERAL STENT PLACEMENT;  Surgeon: Francisca Redell BROCKS, MD;  Location: [facility];  Service: Urology;  Laterality: Left;   CYSTOSCOPY WITH INSERTION OF UROLIFT N/A 08/03/2021   Procedure: CYSTOSCOPY WITH INSERTION OF UROLIFT;  Surgeon: Francisca Redell BROCKS, MD;  Location: [facility];  Service: Urology;  Laterality: N/A;   CYSTOSCOPY WITH LITHOLAPAXY N/A 08/03/2021   Procedure: CYSTOSCOPY WITH LITHOLAPAXY;  Surgeon: Francisca Redell BROCKS, MD;  Location: [facility];  Service: Urology;  Laterality: N/A;   IRRIGATION AND DEBRIDEMENT FOOT Right 02/05/2022   Procedure: IRRIGATION AND DEBRIDEMENT Right Foot Wound;  Surgeon: Neill Boas, DPM;  Location: [facility];  Service: Podiatry;  Laterality: Right;   KIDNEY STONE SURGERY  1979   URETEROSCOPY N/A 08/03/2021   Procedure: URETEROSCOPY;  Surgeon: Francisca Redell BROCKS, MD;  Location: [facility];  Service: Urology;  Laterality: N/A;    Family History  Problem Relation Age of Onset   Diabetes Mother    Diabetes Brother    Throat cancer Brother     Social History   Socioeconomic History   Marital status: Married    Spouse name: Sophia   Number of children: Not on file   Years of education: Not on file   Highest education level: Not on file  Occupational History   Not on file  Tobacco Use   Smoking status: Never   Smokeless tobacco: Never  Vaping Use   Vaping status: Never Used  Substance and Sexual Activity   Alcohol use: No   Drug use: No   Sexual activity: Not on file  Other Topics Concern   Not on file  Social History Narrative   Not on file   Social Drivers of Health   Financial Resource Strain: Patient Declined (04/18/2024)   Overall Financial Resource Strain (CARDIA)    Difficulty of Paying Living Expenses: Patient declined  Food Insecurity: Patient Declined  (04/18/2024)   Hunger Vital Sign    Worried About Running Out of Food in the Last Year: Patient declined    Ran Out of Food in the Last Year: Patient declined  Transportation Needs: No Transportation Needs (04/18/2024)   PRAPARE - Administrator, Civil Service (Medical): No    Lack of Transportation (Non-Medical): No  Physical Activity: Insufficiently Active (04/18/2024)   Exercise Vital Sign    Days of Exercise per Week: 1 day    Minutes of Exercise per Session: 30 min  Stress: Patient Declined (04/18/2024)   [employer] of Occupational Health - Occupational Stress Questionnaire    Feeling of Stress: Patient declined  Social Connections: Unknown (04/18/2024)   Social Connection and Isolation Panel    Frequency of Communication with Friends and Family: Patient declined    Frequency of Social Gatherings with Friends and Family: Patient declined  Attends Religious Services: Patient declined    Active Member of Clubs or Organizations: Patient declined    Attends Banker Meetings: Not on file    Marital Status: Married  Intimate Partner Violence: Unknown (10/31/2021)   Received from [facility]   HITS    Physically Hurt: Not on file    Insult or Talk Down To: Not on file    Threaten Physical Harm: Not on file    Scream or Curse: Not on file    Review of Systems  Neurological:  Positive for tingling.  All other systems reviewed and are negative.       Objective    BP (!) 140/82 (Cuff Size: Large)   Pulse 75   Temp 97.9 F (36.6 C) (Other (Comment))   Resp 16   Wt 191 lb 12.8 oz (87 kg)   SpO2 98%   BMI 28.32 kg/m   Physical Exam Constitutional:      Appearance: Normal appearance.  HENT:     Head: Normocephalic and atraumatic.  Eyes:     Conjunctiva/sclera: Conjunctivae normal.  Cardiovascular:     Rate and Rhythm: Normal rate and regular rhythm.     Pulses:          Dorsalis pedis pulses are 2+ on the right side and 2+ on the left  side.  Pulmonary:     Effort: Pulmonary effort is normal.     Breath sounds: Normal breath sounds.  Musculoskeletal:     Right lower leg: No edema.     Left lower leg: No edema.     Right foot: Normal range of motion. No deformity, bunion, Charcot foot, foot drop or prominent metatarsal heads.     Left foot: Normal range of motion. No deformity, bunion, Charcot foot, foot drop or prominent metatarsal heads.  Feet:     Right foot:     Protective Sensation: 6 sites tested.  6 sites sensed.     Skin integrity: Dry skin present.     Toenail Condition: Right toenails are abnormally thick.     Left foot:     Protective Sensation: 6 sites tested.  6 sites sensed.     Skin integrity: Dry skin present.     Toenail Condition: Left toenails are abnormally thick.  Skin:    General: Skin is warm and dry.  Neurological:     General: No focal deficit present.     Mental Status: He is alert. Mental status is at baseline.  Psychiatric:        Mood and Affect: Mood normal.        Behavior: Behavior normal.         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 Neuropathy in feet with decreased sensation. Recent medication supply issues. - Increase Rybelsus  to 7 mg daily. - Continue metformin 1000 mg twice daily, glimepiride 4 mg twice daily, Januvia 100 mg daily, and Lantus  22-25 units at bedtime based on fasting glucose. - Consider injectable GLP-1 agonists like Mounjaro or Ozempic, noting efficacy and risk of pancreatitis and gallbladder issues. - Refer to podiatrist for regular foot care. - Order urine test to assess kidney function. - Recheck A1c in two months.  Left foot gangrene and osteomyelitis, status post surgical debridement Treated with surgical debridement and antibiotics. No current infection signs, ongoing neuropathy. - Refer to podiatrist for regular foot  care.  Left kidney infection and obstruction, status post bladder stone removal Treated with surgical intervention. No current symptoms. - Order urine test to assess kidney function.  Hyperlipidemia Previous simvastatin  use ineffective. Current cholesterol reportedly normal. - Request recent lab results for review.  - Semaglutide  (RYBELSUS ) 7 MG TABS; Take 1 tablet (7 mg total) by mouth daily.  Dispense: 90 tablet; Refill: 0 - insulin  glargine (LANTUS  SOLOSTAR) 100 UNIT/ML Solostar Pen; Inject 25 Units into the skin daily. - Urine Microalbumin w/creat. ratio - Ambulatory referral to Podiatry - [facility] Diabetes Foot Exam - Cologuard   Return in about 2 months (around 06/21/2024).   Sharyle Fischer, DO

## 2024-04-22 LAB — MICROALBUMIN / CREATININE URINE RATIO
Creatinine, Urine: 185 mg/dL (ref 20–320)
Microalb Creat Ratio: 24 mg/g{creat} (ref <30)
Microalb, Ur: 4.4 mg/dL

## 2024-04-23 ENCOUNTER — Ambulatory Visit: Payer: Self-pay | Admitting: Internal Medicine

## 2024-06-18 ENCOUNTER — Other Ambulatory Visit: Payer: Self-pay

## 2024-06-18 ENCOUNTER — Encounter: Payer: Self-pay | Admitting: Internal Medicine

## 2024-06-18 ENCOUNTER — Ambulatory Visit: Admitting: Internal Medicine

## 2024-06-18 VITALS — BP 118/78 | HR 73 | Resp 16 | Ht 69.25 in | Wt 189.6 lb

## 2024-06-18 DIAGNOSIS — E782 Mixed hyperlipidemia: Secondary | ICD-10-CM

## 2024-06-18 DIAGNOSIS — Z794 Long term (current) use of insulin: Secondary | ICD-10-CM | POA: Diagnosis not present

## 2024-06-18 DIAGNOSIS — E1142 Type 2 diabetes mellitus with diabetic polyneuropathy: Secondary | ICD-10-CM | POA: Diagnosis not present

## 2024-06-18 LAB — POCT GLYCOSYLATED HEMOGLOBIN (HGB A1C): Hemoglobin A1C: 7.7 % — AB (ref 4.0–5.6)

## 2024-06-18 MED ORDER — ROSUVASTATIN CALCIUM 10 MG PO TABS: 10.0000 mg | ORAL_TABLET | Freq: Every day | ORAL | 1 refills | Status: AC

## 2024-06-18 MED ORDER — RYBELSUS 7 MG PO TABS: 7.0000 mg | ORAL_TABLET | Freq: Every day | ORAL | 1 refills | Status: AC

## 2024-06-18 NOTE — Progress Notes (Signed)
 Established Patient Office Visit  Subjective    Patient ID: Ryan Pugh, male    DOB: 1956/06/29  Age: 68 y.o. MRN: [id number]  CC:  Chief Complaint  Patient presents with   Medical Management of Chronic Issues    2 month recheck    HPI [name] presents to follow up on chronic medical conditions.   Discussed the use of AI scribe software for clinical note transcription with the patient, who gave verbal consent to proceed.  History of Present Illness  Ryan Pugh is a 68 year old male with diabetes who presents for follow-up of blood sugar control and medication management.  His estimated A1c over the last three months is 7.1% and 6.6% over the last thirty days, with an average blood sugar of 138 mg/dL, showing improvement. Today's A1c is 7.7%. Current medications include Rybelsus  7 mg, Lantus  25 units, metformin 1000 mg twice daily, glimepiride 4 mg twice daily, and Januvia 100 mg once daily. Insurance approval issues delayed the start of Rybelsus  until October 8th. He has a stockpile of metformin due to previous prescription changes.  He has not had an eye exam this year and is in the process of obtaining new insurance coverage. His LDL was 121 mg/dL, and he has a family history of aneurysms. Recent blood pressure was 118/78 mmHg.  He does not receive flu vaccines due to past adverse reactions while in [employer], where he experienced illness following vaccination. He reports not frequently getting the flu.   Diabetes, Type 2: -Last A1c 10.1% 8/25  -Diagnosed in mid-30's but became more uncontrolled around 2010  -Medications: Metformin 1000 mg BID, Glimepiride 4 mg BID, Rybelsus  increased to 7 mg, Januvia 100 mg, Lantus  25 units at bedtime  -Patient is compliant with the above medications and reports no side effects.  -Checking BG at home: average 135, 138 this morning  -Eye exam: Due, going to schedule after the holidays  -Foot exam: UTD -Microalbumin: UTD -Statin:  No -PNA vaccine: Discuss at follow up -Denies symptoms of hypoglycemia, polyuria, polydipsia. Does have some numbness/tingling and history of gangrene/ulcer.  HLD: -Medications: Nothing currently, had been taking Zocor  40 mg but no longer because cholesterol did not change  -Last lipid panel: 8/25 TC 176, HDL 30, LDL 121, triglycerides 135  The 10-year ASCVD risk score (Arnett DK, et al., 2019) is: 26.1%*   Values used to calculate the score:     Age: 66 years     Clincally relevant sex: Male     Is Non-Hispanic African American: No     Diabetic: Yes     Tobacco smoker: No     Systolic Blood Pressure: 118 mmHg     Is BP treated: No     HDL Cholesterol: 30 mg/dL*     Total Cholesterol: 176 mg/dL*     * - Cholesterol units were assumed for this score calculation   Health Maintenance: -Blood work UTD  -Colon cancer screening: cologuard ordered, results processing -Declined flu vaccine    Outpatient Encounter Medications as of 06/18/2024  Medication Sig   ascorbic acid  (VITAMIN C) 500 MG tablet Take 1 tablet (500 mg total) by mouth 2 (two) times daily.   glimepiride (AMARYL) 4 MG tablet Take 4 mg by mouth 2 (two) times daily.   ibuprofen (ADVIL) 200 MG tablet Take 200 mg by mouth every 6 (six) hours as needed for fever, mild pain or moderate pain.   insulin  glargine (LANTUS  SOLOSTAR) 100 UNIT/ML  Solostar Pen Inject 25 Units into the skin daily.   JANUVIA 100 MG tablet Take 100 mg by mouth daily.   metFORMIN (GLUCOPHAGE) 500 MG tablet Take 1,000 mg by mouth 2 (two) times daily.   Semaglutide  (RYBELSUS ) 7 MG TABS Take 1 tablet (7 mg total) by mouth daily.   No facility-administered encounter medications on file as of 06/18/2024.    Past Medical History:  Diagnosis Date   BPH (benign prostatic hyperplasia)    CKD (chronic kidney disease), stage III (HCC)    Diabetes mellitus without complication (HCC)    type 2   HLD (hyperlipidemia)    Kidney stone     Past Surgical  History:  Procedure Laterality Date   CYSTOSCOPY W/ RETROGRADES  08/03/2021   Procedure: CYSTOSCOPY WITH RETROGRADE PYELOGRAM;  Surgeon: Francisca Redell BROCKS, MD;  Location: [facility];  Service: Urology;;   CYSTOSCOPY W/ URETERAL STENT PLACEMENT Left 07/13/2021   Procedure: CYSTOSCOPY WITH RETROGRADE PYELOGRAM/URETERAL STENT PLACEMENT;  Surgeon: Francisca Redell BROCKS, MD;  Location: [facility];  Service: Urology;  Laterality: Left;   CYSTOSCOPY WITH INSERTION OF UROLIFT N/A 08/03/2021   Procedure: CYSTOSCOPY WITH INSERTION OF UROLIFT;  Surgeon: Francisca Redell BROCKS, MD;  Location: [facility];  Service: Urology;  Laterality: N/A;   CYSTOSCOPY WITH LITHOLAPAXY N/A 08/03/2021   Procedure: CYSTOSCOPY WITH LITHOLAPAXY;  Surgeon: Francisca Redell BROCKS, MD;  Location: [facility];  Service: Urology;  Laterality: N/A;   IRRIGATION AND DEBRIDEMENT FOOT Right 02/05/2022   Procedure: IRRIGATION AND DEBRIDEMENT Right Foot Wound;  Surgeon: Neill Boas, DPM;  Location: [facility];  Service: Podiatry;  Laterality: Right;   KIDNEY STONE SURGERY  1979   URETEROSCOPY N/A 08/03/2021   Procedure: URETEROSCOPY;  Surgeon: Francisca Redell BROCKS, MD;  Location: [facility];  Service: Urology;  Laterality: N/A;    Family History  Problem Relation Age of Onset   Diabetes Mother    Diabetes Brother    Throat cancer Brother     Social History   Socioeconomic History   Marital status: Married    Spouse name: Sophia   Number of children: Not on file   Years of education: Not on file   Highest education level: Not on file  Occupational History   Not on file  Tobacco Use   Smoking status: Never   Smokeless tobacco: Never  Vaping Use   Vaping status: Never Used  Substance and Sexual Activity   Alcohol use: No   Drug use: No   Sexual activity: Not on file  Other Topics Concern   Not on file  Social History Narrative   Not on file   Social Drivers of Health   Financial Resource Strain: Patient Declined (04/18/2024)   Overall Financial Resource  Strain (CARDIA)    Difficulty of Paying Living Expenses: Patient declined  Food Insecurity: Patient Declined (04/18/2024)   Hunger Vital Sign    Worried About Running Out of Food in the Last Year: Patient declined    Ran Out of Food in the Last Year: Patient declined  Transportation Needs: No Transportation Needs (04/18/2024)   PRAPARE - Administrator, Civil Service (Medical): No    Lack of Transportation (Non-Medical): No  Physical Activity: Insufficiently Active (04/18/2024)   Exercise Vital Sign    Days of Exercise per Week: 1 day    Minutes of Exercise per Session: 30 min  Stress: Patient Declined (04/18/2024)   [employer] of Occupational Health - Occupational Stress Questionnaire  Feeling of Stress: Patient declined  Social Connections: Unknown (04/18/2024)   Social Connection and Isolation Panel    Frequency of Communication with Friends and Family: Patient declined    Frequency of Social Gatherings with Friends and Family: Patient declined    Attends Religious Services: Patient declined    Database Administrator or Organizations: Patient declined    Attends Banker Meetings: Not on file    Marital Status: Married  Intimate Partner Violence: Unknown (10/31/2021)   Received from [facility]   HITS    Physically Hurt: Not on file    Insult or Talk Down To: Not on file    Threaten Physical Harm: Not on file    Scream or Curse: Not on file    Review of Systems  All other systems reviewed and are negative.       Objective    BP 118/78 (Cuff Size: Large)   Pulse 73   Resp 16   Ht 5' 9.25 (1.759 m)   Wt 189 lb 9.6 oz (86 kg)   SpO2 99%   BMI 27.80 kg/m   Physical Exam Constitutional:      Appearance: Normal appearance.  HENT:     Head: Normocephalic and atraumatic.  Eyes:     Conjunctiva/sclera: Conjunctivae normal.  Cardiovascular:     Rate and Rhythm: Normal rate and regular rhythm.  Pulmonary:     Effort: Pulmonary effort  is normal.     Breath sounds: Normal breath sounds.  Skin:    General: Skin is warm and dry.  Neurological:     General: No focal deficit present.     Mental Status: He is alert. Mental status is at baseline.  Psychiatric:        Mood and Affect: Mood normal.        Behavior: Behavior normal.         Assessment & Plan:   Assessment & Plan  Type 2 diabetes mellitus with diabetic polyneuropathy Type 2 diabetes with improved glycemic control. A1c decreased to 7.7%, estimated A1c 6.6%. Fasting glucose averages 138 mg/dL. Current regimen effective, no Lantus  increase needed. - Continue metformin 1000 mg twice daily. - Continue glimepiride 4 mg twice daily. - Continue Rybelsus  7 mg daily. - Continue Januvia 100 mg daily. - Continue Lantus  25 units daily. - Recheck A1c in 3 months.  Mixed hyperlipidemia LDL 121 mg/dL. High cardiovascular risk due to diabetes. Previous simvastatin  ineffective. Rosuvastatin  preferred for risk reduction. - Start rosuvastatin  10 mg daily. - Recheck lipid panel in one year. - Monitor blood pressure control.   - POCT HgB A1C - Semaglutide  (RYBELSUS ) 7 MG TABS; Take 1 tablet (7 mg total) by mouth daily.  Dispense: 90 tablet; Refill: 1 - rosuvastatin  (CRESTOR ) 10 MG tablet; Take 1 tablet (10 mg total) by mouth daily.  Dispense: 90 tablet; Refill: 1   Return in about 3 months (around 09/18/2024) for follow up on a1c.   Sharyle Fischer, DO

## 2024-06-21 LAB — COLOGUARD: COLOGUARD: NEGATIVE

## 2024-09-20 ENCOUNTER — Ambulatory Visit: Admitting: Internal Medicine
